# Patient Record
Sex: Female | Born: 2005 | Race: Black or African American | Hispanic: No | Marital: Single | Smoking: Never smoker
Health system: Southern US, Community
[De-identification: ages and names within clinical notes are randomized; demographics above are authoritative.]

## PROBLEM LIST (undated history)

## (undated) DIAGNOSIS — J40 Bronchitis, not specified as acute or chronic: Secondary | ICD-10-CM

## (undated) DIAGNOSIS — E669 Obesity, unspecified: Secondary | ICD-10-CM

## (undated) DIAGNOSIS — J45909 Unspecified asthma, uncomplicated: Secondary | ICD-10-CM

## (undated) DIAGNOSIS — F419 Anxiety disorder, unspecified: Secondary | ICD-10-CM

## (undated) HISTORY — DX: Anxiety disorder, unspecified: F41.9

## (undated) HISTORY — DX: Obesity, unspecified: E66.9

---
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
## 2023-01-06 ENCOUNTER — Ambulatory Visit
Admission: EM | Admit: 2023-01-06 | Discharge: 2023-01-06 | Disposition: A | Discharge status: Home / Self Care | Payer: BC Managed Care – PPO | Attending: Nurse Practitioner | Admitting: Nurse Practitioner

## 2023-01-06 DIAGNOSIS — Z1152 Encounter for screening for COVID-19: Secondary | ICD-10-CM | POA: Insufficient documentation

## 2023-01-06 DIAGNOSIS — Z20822 Contact with and (suspected) exposure to covid-19: Secondary | ICD-10-CM | POA: Insufficient documentation

## 2023-01-06 NOTE — ED Provider Notes (Signed)
[facility]    CSN: [id number] Arrival date & time: 01/06/23  1448      History   Chief Complaint No chief complaint on file.   HPI Katie Erickson is a 17 y.o. female.   The history is provided by the patient.   Patient presents for COVID testing.  Patient states she was exposed by her friend who recently tested positive.  Patient denies symptoms to include fever, chills, headache, nasal congestion, runny nose, cough, chest pain, nausea, vomiting, or diarrhea.  She states that she does have mild abdominal pain, but states this is normal around the time of her menstrual cycle.  Patient does have a history of asthma.  Past Medical History:  Diagnosis Date   Asthma    Bronchitis    Obesity     Patient Active Problem List   Diagnosis Date Noted   Encounter for initial prescription of contraceptive pills 12/09/2022   Pregnancy examination or test, negative result 12/09/2022   History of chest pain 12/09/2022   Family history of heart disease 12/09/2022   Family history of cardiac arrest 12/09/2022   Chest pain 08/12/2022   Encounter for surveillance of contraceptive pills 05/31/2022   Screening examination for STD (sexually transmitted disease) 05/31/2022   Encounter for menstrual regulation 09/02/2020   Dysmenorrhea 09/02/2020   Irregular periods 09/02/2020   BMI (body mass index), pediatric, greater than or equal to 95% for age 41/22/2021   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 No pertinent surgical history.  OB History     Gravida  0   Para  0   Term  0   Preterm  0   AB  0   Living  0      SAB  0   IAB  0   Ectopic  0   Multiple  0   Live Births  0            Home Medications    Prior to Admission medications   Medication Sig Start Date End Date Taking? Authorizing Provider  cetirizine (ZYRTEC) 10 MG tablet Take 1 tablet (10 mg total) by mouth daily. 02/23/22   Kellie Murrill-Warren,  Sadie Haber, NP  fluticasone (FLONASE) 50 MCG/ACT nasal spray Place 2 sprays into both nostrils daily. Patient taking differently: Place 2 sprays into both nostrils as needed. 02/23/22   Altin Sease-Warren, Sadie Haber, NP  ibuprofen (ADVIL) 800 MG tablet Take 1 tablet (800 mg total) by mouth every 6 (six) hours as needed for moderate pain. 10/07/22   Gilda Crease, MD  lidocaine (XYLOCAINE) 2 % solution Gargle and spit 5 mL every 6 hours as needed for throat pain or discomfort. 10/04/22   Bexton Haak-Warren, Sadie Haber, NP  loperamide (IMODIUM) 2 MG capsule Take 1 capsule (2 mg total) by mouth 4 (four) times daily as needed for diarrhea or loose stools. 06/04/22   Aarsh Fristoe-Warren, Sadie Haber, NP  loratadine (CLARITIN) 10 MG tablet TAKE 1 TABLET BY MOUTH ONCE DAILY. 08/08/20   Richrd Sox, MD  montelukast (SINGULAIR) 4 MG chewable tablet CHEW 1 TABLET BY MOUTH AT BEDTIME. 08/08/20   Richrd Sox, MD  nitroGLYCERIN (NITROSTAT) 0.4 MG SL tablet Place 1 tablet (0.4 mg total) under the tongue every 5 (five) minutes as needed for chest pain. 08/12/22   Del Nigel Berthold, FNP  norethindrone (MICRONOR) 0.35 MG tablet Take 1 tablet (0.35 mg total) by mouth daily. 12/09/22  Cyril Mourning A, NP  pantoprazole (PROTONIX) 40 MG tablet Take 1 tablet (40 mg total) by mouth daily. 10/07/22   Gilda Crease, MD  triamcinolone cream (KENALOG) 0.1 % SMARTSIG:1 Application Topical 2-3 Times Daily 07/03/20   [provider]    Family History Family History  Problem Relation Age of Onset   Hypertension Paternal Grandfather    Hypertension Paternal Grandmother    Heart disease Paternal Grandmother    Kidney disease Paternal Grandmother    Hypertension Maternal Grandmother    Diabetes Maternal Grandmother    Bronchitis Mother    Diabetes Mother    Asthma Sister    Other Sister        cardiac arrest at age 110   Cancer Other        on both sides of family   Heart disease Other 51       had MI and  died    Social History Social History   Tobacco Use   Smoking status: Never    Passive exposure: Yes   Smokeless tobacco: Never   Tobacco comments:    mom  Vaping Use   Vaping status: Never Used  Substance Use Topics   Alcohol use: Not Currently    Comment: once in a while   Drug use: Yes    Types: Marijuana    Comment: once in a while     Allergies   Cinnamon   Review of Systems Review of Systems Per HPI  Physical Exam Triage Vital Signs ED Triage Vitals  Encounter Vitals Group     BP 01/06/23 1454 119/75     Systolic BP Percentile --      Diastolic BP Percentile --      Pulse Rate 01/06/23 1454 91     Resp 01/06/23 1454 20     Temp 01/06/23 1454 98.8 F (37.1 C)     Temp Source 01/06/23 1454 Oral     SpO2 01/06/23 1454 97 %     Weight --      Height --      Head Circumference --      Peak Flow --      Pain Score 01/06/23 1455 0     Pain Loc --      Pain Education --      Exclude from Growth Chart --    No data found.  Updated Vital Signs BP 119/75 (BP Location: Right Arm)   Pulse 91   Temp 98.8 F (37.1 C) (Oral)   Resp 20   LMP 12/06/2022   SpO2 97%   Visual Acuity Right Eye Distance:   Left Eye Distance:   Bilateral Distance:    Right Eye Near:   Left Eye Near:    Bilateral Near:     Physical Exam Vitals and nursing note reviewed.  Constitutional:      General: She is not in acute distress.    Appearance: Normal appearance.  HENT:     Head: Normocephalic.     Right Ear: Tympanic membrane, ear canal and external ear normal.     Left Ear: Tympanic membrane, ear canal and external ear normal.     Nose: Nose normal.     Mouth/Throat:     Lips: Pink.     Mouth: Mucous membranes are moist.     Pharynx: Oropharynx is clear. Uvula midline. No pharyngeal swelling, posterior oropharyngeal erythema or postnasal drip.  Eyes:     Extraocular Movements: Extraocular  movements intact.     Conjunctiva/sclera: Conjunctivae normal.      Pupils: Pupils are equal, round, and reactive to light.  Cardiovascular:     Rate and Rhythm: Normal rate and regular rhythm.     Pulses: Normal pulses.     Heart sounds: Normal heart sounds.  Pulmonary:     Effort: Pulmonary effort is normal. No respiratory distress.     Breath sounds: Normal breath sounds. No stridor. No wheezing, rhonchi or rales.  Abdominal:     General: Bowel sounds are normal.     Palpations: Abdomen is soft.     Tenderness: There is no abdominal tenderness.  Musculoskeletal:     Cervical back: Normal range of motion.  Skin:    General: Skin is warm and dry.  Neurological:     General: No focal deficit present.     Mental Status: She is alert and oriented to person, place, and time.  Psychiatric:        Mood and Affect: Mood normal.        Behavior: Behavior normal.      UC Treatments / Results  Labs (all labs ordered are listed, but only abnormal results are displayed) Labs Reviewed  SARS CORONAVIRUS 2 (TAT 6-24 HRS)    EKG   Radiology No results found.  Procedures Procedures (including critical care time)  Medications Ordered in UC Medications - No data to display  Initial Impression / Assessment and Plan / UC Course  I have reviewed the triage vital signs and the nursing notes.  Pertinent labs & imaging results that were available during my care of the patient were reviewed by me and considered in my medical decision making (see chart for details).  Patient presents for COVID testing, she currently is asymptomatic.  Patient is able to receive Paxlovid if her COVID test is positive.  Supportive care recommendations were provided and discussed with the patient to include over-the-counter analgesics for pain or discomfort, and to wear a mask if she develops symptoms.  Patient was advised if she develops fever, she should remain home until she has been fever free for at least 24 hours.  Patient is in agreement with this plan of care and  verbalizes understanding.  All questions were answered.  Patient stable for discharge.   Final Clinical Impressions(s) / UC Diagnoses   Final diagnoses:  Close exposure to COVID-19 virus  Encounter for screening for COVID-19     Discharge Instructions      Your results will be available within the next 24 hours.  You can access your results via MyChart.  If your test is positive, you will be contacted. If you begin to develop symptoms, you should wear a mask until symptoms have resolved. If you develop fever, you should remain home until you have been fever free for at least 24 hours with no medication. Follow-up as needed.     ED Prescriptions   None    PDMP not reviewed this encounter.   Abran Cantor, NP 01/06/23 [phone]

## 2023-01-06 NOTE — Discharge Instructions (Signed)
Your results will be available within the next 24 hours.  You can access your results via MyChart.  If your test is positive, you will be contacted. If you begin to develop symptoms, you should wear a mask until symptoms have resolved. If you develop fever, you should remain home until you have been fever free for at least 24 hours with no medication. Follow-up as needed.

## 2023-01-06 NOTE — ED Triage Notes (Signed)
Pt reports she has been exposed to Covid and wants to be tested.

## 2023-01-07 LAB — SARS CORONAVIRUS 2 (TAT 6-24 HRS): SARS Coronavirus 2: NEGATIVE

## 2023-01-13 ENCOUNTER — Encounter: Payer: Self-pay | Admitting: *Deleted

## 2023-01-14 ENCOUNTER — Ambulatory Visit: Payer: BC Managed Care – PPO | Admitting: Internal Medicine

## 2023-01-19 ENCOUNTER — Ambulatory Visit: Payer: BC Managed Care – PPO | Admitting: Family Medicine

## 2023-02-02 ENCOUNTER — Telehealth: Payer: Self-pay | Admitting: Family Medicine

## 2023-02-02 ENCOUNTER — Encounter: Payer: Self-pay | Admitting: Family Medicine

## 2023-02-02 ENCOUNTER — Ambulatory Visit: Payer: BC Managed Care – PPO | Admitting: Family Medicine

## 2023-02-02 VITALS — BP 111/76 | HR 89 | Ht 65.0 in | Wt 234.0 lb

## 2023-02-02 DIAGNOSIS — L259 Unspecified contact dermatitis, unspecified cause: Secondary | ICD-10-CM

## 2023-02-02 MED ORDER — HYDROXYZINE HCL 10 MG PO TABS
10.0000 mg | ORAL_TABLET | Freq: Three times a day (TID) | ORAL | 0 refills | Status: DC | PRN
Start: 2023-02-02 — End: 2023-05-19

## 2023-02-02 MED ORDER — TRIAMCINOLONE ACETONIDE 0.1 % EX CREA
TOPICAL_CREAM | Freq: Two times a day (BID) | CUTANEOUS | 3 refills | Status: AC
Start: 2023-02-02 — End: ?

## 2023-02-02 NOTE — Telephone Encounter (Signed)
There is no message. Did you mean to send one?

## 2023-02-02 NOTE — Assessment & Plan Note (Signed)
(  KENALOG) 0.1 % 2 times daily Hydroxyzine PRN Moisturize regularly: Apply a fragrance-free, hypoallergenic moisturizer (e.g., Cetaphil, CeraVe, or Eucerin) to help restore the skin barrier and prevent dryness.  Discussed watch for signs of infection: If the rash becomes increasingly red, warm, swollen, or develops pus, you may have a skin infection- Follow up

## 2023-02-02 NOTE — Patient Instructions (Signed)
        Great to see you today.  I have refilled the medication(s) we provide.    - Please take medications as prescribed. - Follow up with your primary health provider if any health concerns arises. - If symptoms worsen please contact your primary care provider and/or visit the emergency department.  

## 2023-02-02 NOTE — Telephone Encounter (Deleted)
Tabitha called from [employer] asking seen patient for her oxygen last 6 months, need [id number] notes faxed to [phone] and any questions call back # [phone] ext. [phone]

## 2023-02-02 NOTE — Progress Notes (Signed)
Patient Office Visit   Subjective   Patient ID: Katie Erickson, female    DOB: 08/06/2005  Age: 17 y.o. MRN: [id number]  CC:  Chief Complaint  Patient presents with   Rash    Patient complains of bilateral rash on arms starting about a month ago.     HPI Katie Erickson 17 year old  presents to the clinical for bilateral rash on arms. She  has a past medical history of Asthma, Bronchitis, and Obesity.  Rash This is a new problem. The problem has been resolved since onset. The affected locations include the right arm and left arm. The rash is characterized by dryness and itchiness. It is unknown if there was an exposure to a precipitant. Pertinent negatives include no congestion, fatigue or shortness of breath. Past treatments include moisturizer and anti-itch cream. The treatment provided significant relief. Her past medical history is significant for eczema.      Outpatient Encounter Medications as of 02/02/2023  Medication Sig   cetirizine (ZYRTEC) 10 MG tablet Take 1 tablet (10 mg total) by mouth daily.   fluticasone (FLONASE) 50 MCG/ACT nasal spray Place 2 sprays into both nostrils daily. (Patient taking differently: Place 2 sprays into both nostrils as needed.)   hydrOXYzine (ATARAX) 10 MG tablet Take 1 tablet (10 mg total) by mouth 3 (three) times daily as needed.   ibuprofen (ADVIL) 800 MG tablet Take 1 tablet (800 mg total) by mouth every 6 (six) hours as needed for moderate pain.   lidocaine (XYLOCAINE) 2 % solution Gargle and spit 5 mL every 6 hours as needed for throat pain or discomfort.   loperamide (IMODIUM) 2 MG capsule Take 1 capsule (2 mg total) by mouth 4 (four) times daily as needed for diarrhea or loose stools.   loratadine (CLARITIN) 10 MG tablet TAKE 1 TABLET BY MOUTH ONCE DAILY.   montelukast (SINGULAIR) 4 MG chewable tablet CHEW 1 TABLET BY MOUTH AT BEDTIME.   nitroGLYCERIN (NITROSTAT) 0.4 MG SL tablet Place 1 tablet (0.4 mg total) under the tongue every 5  (five) minutes as needed for chest pain.   norethindrone (MICRONOR) 0.35 MG tablet Take 1 tablet (0.35 mg total) by mouth daily.   pantoprazole (PROTONIX) 40 MG tablet Take 1 tablet (40 mg total) by mouth daily.   [DISCONTINUED] triamcinolone cream (KENALOG) 0.1 % SMARTSIG:1 Application Topical 2-3 Times Daily   triamcinolone cream (KENALOG) 0.1 % Apply topically 2 (two) times daily.   No facility-administered encounter medications on file as of 02/02/2023.    No past surgical history on file.  Review of Systems  Constitutional:  Negative for fatigue.  HENT:  Negative for congestion.   Eyes:  Negative for blurred vision.  Respiratory:  Negative for shortness of breath.   Cardiovascular:  Negative for chest pain.  Skin:  Positive for itching and rash.  Neurological:  Negative for dizziness.      Objective    BP 111/76   Pulse 89   Ht 5\' 5"  (1.651 m)   Wt 234 lb (106.1 kg)   SpO2 97%   BMI 38.94 kg/m   Physical Exam Vitals reviewed.  Constitutional:      General: She is not in acute distress.    Appearance: Normal appearance. She is not ill-appearing, toxic-appearing or diaphoretic.  HENT:     Head: Normocephalic.  Eyes:     General:        Right eye: No discharge.  Left eye: No discharge.     Conjunctiva/sclera: Conjunctivae normal.  Cardiovascular:     Rate and Rhythm: Normal rate.     Pulses: Normal pulses.     Heart sounds: Normal heart sounds.  Pulmonary:     Effort: Pulmonary effort is normal. No respiratory distress.     Breath sounds: Normal breath sounds.  Musculoskeletal:        General: Normal range of motion.     Cervical back: Normal range of motion.  Skin:    General: Skin is warm and dry.     Capillary Refill: Capillary refill takes less than 2 seconds.     Findings: No bruising, erythema, lesion or rash.  Neurological:     Mental Status: She is alert.     Coordination: Coordination normal.     Gait: Gait normal.  Psychiatric:         Mood and Affect: Mood normal.       Assessment & Plan:  Contact dermatitis and eczema Assessment & Plan: (KENALOG) 0.1 % 2 times daily Hydroxyzine PRN Moisturize regularly: Apply a fragrance-free, hypoallergenic moisturizer (e.g., Cetaphil, CeraVe, or Eucerin) to help restore the skin barrier and prevent dryness.  Discussed watch for signs of infection: If the rash becomes increasingly red, warm, swollen, or develops pus, you may have a skin infection- Follow up  Orders: -     hydrOXYzine HCl; Take 1 tablet (10 mg total) by mouth 3 (three) times daily as needed.  Dispense: 21 tablet; Refill: 0 -     Triamcinolone Acetonide; Apply topically 2 (two) times daily.  Dispense: 80 g; Refill: 3    Return if symptoms worsen or fail to improve.   Cruzita Lederer Newman Nip, FNP

## 2023-02-04 NOTE — Telephone Encounter (Signed)
No sorry.

## 2023-02-11 ENCOUNTER — Ambulatory Visit
Admission: EM | Admit: 2023-02-11 | Discharge: 2023-02-11 | Disposition: A | Discharge status: Home / Self Care | Payer: BC Managed Care – PPO | Attending: Nurse Practitioner | Admitting: Nurse Practitioner

## 2023-02-11 DIAGNOSIS — J069 Acute upper respiratory infection, unspecified: Secondary | ICD-10-CM | POA: Insufficient documentation

## 2023-02-11 DIAGNOSIS — J029 Acute pharyngitis, unspecified: Secondary | ICD-10-CM | POA: Diagnosis not present

## 2023-02-11 DIAGNOSIS — Z8709 Personal history of other diseases of the respiratory system: Secondary | ICD-10-CM | POA: Diagnosis not present

## 2023-02-11 LAB — POCT RAPID STREP A (OFFICE): Rapid Strep A Screen: NEGATIVE

## 2023-02-11 MED ORDER — PSEUDOEPH-BROMPHEN-DM 30-2-10 MG/5ML PO SYRP: 5.0000 mL | ORAL_SOLUTION | Freq: Four times a day (QID) | ORAL | 0 refills | Status: DC | PRN

## 2023-02-11 MED ORDER — FLUTICASONE PROPIONATE 50 MCG/ACT NA SUSP: 2.0000 | Freq: Every day | NASAL | 0 refills | Status: AC

## 2023-02-11 MED ORDER — PREDNISONE 20 MG PO TABS: 40.0000 mg | ORAL_TABLET | Freq: Every day | ORAL | 0 refills | Status: AC

## 2023-02-11 NOTE — Discharge Instructions (Signed)
The rapid strep test was negative.  A throat culture is pending.  You will be contacted if the pending test result is abnormal. Take medication as prescribed. Increase fluids and allow for plenty of rest. May take over-the-counter Tylenol or ibuprofen as needed for pain, fever, general discomfort. Warm salt water gargles 3-4 times daily as needed for throat pain or discomfort. Recommend a soft diet to include soup, broth, yogurt, pudding, or Jell-O while symptoms persist. Recommend throat lozenges or Chloraseptic throat spray for throat pain or discomfort. Recommend use of a humidifier in your bedroom at nighttime during sleep and sleeping elevated on pillows while cough symptoms persist. If you experience wheezing, difficulty breathing, shortness of breath, or become unable to speak in a complete sentence, please go to the emergency department immediately. If symptoms do not improve over the next 7 to 10 days, or worsen over that time, you may follow-up in this clinic for further evaluation. Follow-up as needed.

## 2023-02-11 NOTE — ED Provider Notes (Signed)
[facility]    CSN: [id number] Arrival date & time: 02/11/23  1155      History   Chief Complaint Chief Complaint  Patient presents with   Sore Throat    HPI Katie Erickson is a 17 y.o. female.   The history is provided by the patient.   Patient presents for complaints of nasal congestion, runny nose, sore throat, and cough.  Symptoms have been present for the past 2 days.  Patient denies fever, chills, headache, wheezing, difficulty breathing, chest pain, abdominal pain, nausea, vomiting, diarrhea, or rash.  Patient reports she has been taking an old prescription of amoxicillin, allergy medication and ibuprofen with minimal relief.  Patient denies any obvious known sick contacts.  Patient declines COVID testing today.  Past Medical History:  Diagnosis Date   Asthma    Bronchitis    Obesity     Patient Active Problem List   Diagnosis Date Noted   Contact dermatitis and eczema 02/02/2023   Encounter for initial prescription of contraceptive pills 12/09/2022   Pregnancy examination or test, negative result 12/09/2022   History of chest pain 12/09/2022   Family history of heart disease 12/09/2022   Family history of cardiac arrest 12/09/2022   Chest pain 08/12/2022   Encounter for surveillance of contraceptive pills 05/31/2022   Screening examination for STD (sexually transmitted disease) 05/31/2022   Encounter for menstrual regulation 09/02/2020   Dysmenorrhea 09/02/2020   Irregular periods 09/02/2020   BMI (body mass index), pediatric, greater than or equal to 95% for age 12/23/2019   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 No pertinent surgical history.  OB History     Gravida  0   Para  0   Term  0   Preterm  0   AB  0   Living  0      SAB  0   IAB  0   Ectopic  0   Multiple  0   Live Births  0            Home Medications    Prior to Admission medications   Medication Sig  Start Date End Date Taking? Authorizing Provider  brompheniramine-pseudoephedrine-DM 30-2-10 MG/5ML syrup Take 5 mLs by mouth 4 (four) times daily as needed. 02/11/23  Yes Shawnika Pepin-Warren, Sadie Haber, NP  fluticasone (FLONASE) 50 MCG/ACT nasal spray Place 2 sprays into both nostrils daily. 02/11/23  Yes Faithe Ariola-Warren, Sadie Haber, NP  predniSONE (DELTASONE) 20 MG tablet Take 2 tablets (40 mg total) by mouth daily with breakfast for 5 days. 02/11/23 02/16/23 Yes Caasi Giglia-Warren, Sadie Haber, NP  cetirizine (ZYRTEC) 10 MG tablet Take 1 tablet (10 mg total) by mouth daily. 02/23/22   Ithzel Fedorchak-Warren, Sadie Haber, NP  hydrOXYzine (ATARAX) 10 MG tablet Take 1 tablet (10 mg total) by mouth 3 (three) times daily as needed. 02/02/23   Del Nigel Berthold, FNP  ibuprofen (ADVIL) 800 MG tablet Take 1 tablet (800 mg total) by mouth every 6 (six) hours as needed for moderate pain. 10/07/22   Gilda Crease, MD  lidocaine (XYLOCAINE) 2 % solution Gargle and spit 5 mL every 6 hours as needed for throat pain or discomfort. 10/04/22   Lerin Jech-Warren, Sadie Haber, NP  loperamide (IMODIUM) 2 MG capsule Take 1 capsule (2 mg total) by mouth 4 (four) times daily as needed for diarrhea or loose stools. 06/04/22   Kade Demicco-Warren, Sadie Haber, NP  loratadine (CLARITIN) 10 MG tablet  TAKE 1 TABLET BY MOUTH ONCE DAILY. 08/08/20   Richrd Sox, MD  montelukast (SINGULAIR) 4 MG chewable tablet CHEW 1 TABLET BY MOUTH AT BEDTIME. 08/08/20   Richrd Sox, MD  nitroGLYCERIN (NITROSTAT) 0.4 MG SL tablet Place 1 tablet (0.4 mg total) under the tongue every 5 (five) minutes as needed for chest pain. 08/12/22   Del Nigel Berthold, FNP  norethindrone (MICRONOR) 0.35 MG tablet Take 1 tablet (0.35 mg total) by mouth daily. 12/09/22   Adline Potter, NP  pantoprazole (PROTONIX) 40 MG tablet Take 1 tablet (40 mg total) by mouth daily. 10/07/22   Gilda Crease, MD  triamcinolone cream (KENALOG) 0.1 % Apply topically 2 (two) times daily.  02/02/23   Del Nigel Berthold, FNP    Family History Family History  Problem Relation Age of Onset   Hypertension Paternal Grandfather    Hypertension Paternal Grandmother    Heart disease Paternal Grandmother    Kidney disease Paternal Grandmother    Hypertension Maternal Grandmother    Diabetes Maternal Grandmother    Bronchitis Mother    Diabetes Mother    Asthma Sister    Other Sister        cardiac arrest at age 71   Cancer Other        on both sides of family   Heart disease Other 81       had MI and died    Social History Social History   Tobacco Use   Smoking status: Never    Passive exposure: Yes   Smokeless tobacco: Never   Tobacco comments:    mom  Vaping Use   Vaping status: Never Used  Substance Use Topics   Alcohol use: Not Currently    Comment: once in a while   Drug use: Yes    Types: Marijuana    Comment: once in a while     Allergies   Cinnamon   Review of Systems Review of Systems Per HPI  Physical Exam Triage Vital Signs ED Triage Vitals  Encounter Vitals Group     BP 02/11/23 1325 126/73     Systolic BP Percentile --      Diastolic BP Percentile --      Pulse Rate 02/11/23 1325 99     Resp 02/11/23 1325 20     Temp 02/11/23 1325 99.5 F (37.5 C)     Temp Source 02/11/23 1325 Oral     SpO2 02/11/23 1325 97 %     Weight --      Height --      Head Circumference --      Peak Flow --      Pain Score 02/11/23 1327 3     Pain Loc --      Pain Education --      Exclude from Growth Chart --    No data found.  Updated Vital Signs BP 126/73 (BP Location: Right Arm)   Pulse 99   Temp 99.5 F (37.5 C) (Oral)   Resp 20   LMP 02/06/2023   SpO2 97%   Visual Acuity Right Eye Distance:   Left Eye Distance:   Bilateral Distance:    Right Eye Near:   Left Eye Near:    Bilateral Near:     Physical Exam Vitals and nursing note reviewed.  Constitutional:      General: She is not in acute distress.    Appearance: She  is well-developed.  HENT:     Head: Normocephalic.     Right Ear: Tympanic membrane and ear canal normal.     Left Ear: Tympanic membrane and ear canal normal.     Nose: Congestion present.     Right Turbinates: Enlarged and swollen.     Left Turbinates: Enlarged and swollen.     Right Sinus: No maxillary sinus tenderness or frontal sinus tenderness.     Left Sinus: No maxillary sinus tenderness or frontal sinus tenderness.     Mouth/Throat:     Mouth: Mucous membranes are moist.     Pharynx: Posterior oropharyngeal erythema present. No pharyngeal swelling, oropharyngeal exudate or uvula swelling.     Tonsils: No tonsillar exudate. 1+ on the right. 1+ on the left.     Comments: Cobblestoning present to posterior oropharynx  Eyes:     Conjunctiva/sclera: Conjunctivae normal.     Pupils: Pupils are equal, round, and reactive to light.  Cardiovascular:     Rate and Rhythm: Normal rate and regular rhythm.     Heart sounds: Normal heart sounds.  Pulmonary:     Effort: Pulmonary effort is normal. No respiratory distress.     Breath sounds: Normal breath sounds. No stridor. No wheezing, rhonchi or rales.  Abdominal:     General: Bowel sounds are normal.     Palpations: Abdomen is soft.     Tenderness: There is no abdominal tenderness.  Musculoskeletal:     Cervical back: Normal range of motion.  Lymphadenopathy:     Cervical: No cervical adenopathy.  Skin:    General: Skin is warm and dry.  Neurological:     General: No focal deficit present.     Mental Status: She is alert and oriented to person, place, and time.  Psychiatric:        Mood and Affect: Mood normal.        Behavior: Behavior normal.      UC Treatments / Results  Labs (all labs ordered are listed, but only abnormal results are displayed) Labs Reviewed  CULTURE, GROUP A STREP [facility])  POCT RAPID STREP A (OFFICE)    EKG   Radiology No results found.  Procedures Procedures (including critical care  time)  Medications Ordered in UC Medications - No data to display  Initial Impression / Assessment and Plan / UC Course  I have reviewed the triage vital signs and the nursing notes.  Pertinent labs & imaging results that were available during my care of the patient were reviewed by me and considered in my medical decision making (see chart for details).  The patient is well-appearing, she is in no acute distress, vital signs are stable.  Patient declines COVID testing.  Do suspect a viral upper respiratory infection, with exacerbation of her underlying seasonal allergies and mild exacerbation of her asthma.  Will provide symptomatic treatment with Bromfed-DM for her cough, fluticasone 50 mcg nasal spray for nasal congestion and runny nose, and prednisone to treat underlying asthma and cough.  Supportive care recommendations were provided and discussed with the patient to include increasing fluids, allowing for plenty of rest, warm salt water gargles, and use of throat spray or cough drops for throat pain or discomfort.  Patient was given strict ER follow-up precautions along with indications of when to follow-up in this clinic.  Patient is in agreement with this plan of care and verbalizes understanding.  All questions were answered.  Patient stable for discharge.  Final Clinical Impressions(s) / UC  Diagnoses   Final diagnoses:  Viral upper respiratory tract infection with cough  Sore throat  History of asthma  History of allergic rhinitis     Discharge Instructions      The rapid strep test was negative.  A throat culture is pending.  You will be contacted if the pending test result is abnormal. Take medication as prescribed. Increase fluids and allow for plenty of rest. May take over-the-counter Tylenol or ibuprofen as needed for pain, fever, general discomfort. Warm salt water gargles 3-4 times daily as needed for throat pain or discomfort. Recommend a soft diet to include soup,  broth, yogurt, pudding, or Jell-O while symptoms persist. Recommend throat lozenges or Chloraseptic throat spray for throat pain or discomfort. Recommend use of a humidifier in your bedroom at nighttime during sleep and sleeping elevated on pillows while cough symptoms persist. If you experience wheezing, difficulty breathing, shortness of breath, or become unable to speak in a complete sentence, please go to the emergency department immediately. If symptoms do not improve over the next 7 to 10 days, or worsen over that time, you may follow-up in this clinic for further evaluation. Follow-up as needed.     ED Prescriptions     Medication Sig Dispense Auth. Provider   brompheniramine-pseudoephedrine-DM 30-2-10 MG/5ML syrup Take 5 mLs by mouth 4 (four) times daily as needed. 140 mL Laurel Smeltz-Warren, Sadie Haber, NP   fluticasone (FLONASE) 50 MCG/ACT nasal spray Place 2 sprays into both nostrils daily. 16 g Nickie Warwick-Warren, Sadie Haber, NP   predniSONE (DELTASONE) 20 MG tablet Take 2 tablets (40 mg total) by mouth daily with breakfast for 5 days. 10 tablet Ivory Bail-Warren, Sadie Haber, NP      PDMP not reviewed this encounter.   Abran Cantor, NP 02/11/23 [phone]

## 2023-02-11 NOTE — ED Triage Notes (Signed)
Pt reports head, stomach, nasal congestion and throat pain x 2 days.    Took allergy meds and ibuprofen but no relief.

## 2023-02-14 LAB — CULTURE, GROUP A STREP (THRC)

## 2023-02-22 ENCOUNTER — Encounter: Payer: Self-pay | Admitting: Internal Medicine

## 2023-02-22 ENCOUNTER — Encounter: Payer: Self-pay | Admitting: *Deleted

## 2023-02-22 ENCOUNTER — Telehealth: Payer: Self-pay | Admitting: Internal Medicine

## 2023-02-22 ENCOUNTER — Ambulatory Visit: Payer: BC Managed Care – PPO | Attending: Internal Medicine | Admitting: Internal Medicine

## 2023-02-22 VITALS — BP 122/80 | HR 90 | Ht 65.0 in | Wt 230.2 lb

## 2023-02-22 DIAGNOSIS — Z8241 Family history of sudden cardiac death: Secondary | ICD-10-CM | POA: Diagnosis not present

## 2023-02-22 DIAGNOSIS — Z136 Encounter for screening for cardiovascular disorders: Secondary | ICD-10-CM | POA: Diagnosis not present

## 2023-02-22 NOTE — Patient Instructions (Addendum)
Medication Instructions:  Your physician recommends that you continue on your current medications as directed. Please refer to the Current Medication list given to you today.  Labwork: none  Testing/Procedures: Your physician has requested that you have an echocardiogram. Echocardiography is a painless test that uses sound waves to create images of your heart. It provides your doctor with information about the size and shape of your heart and how well your heart's chambers and valves are working. This procedure takes approximately one hour. There are no restrictions for this procedure. Please do NOT wear cologne, perfume, aftershave, or lotions (deodorant is allowed). Please arrive 15 minutes prior to your appointment time. Your physician has requested that you have an exercise tolerance test. For further information please visit [email or web address]. Please also follow instruction sheet, as given.  Follow-Up: Your physician recommends that you schedule a follow-up appointment in: pending  Any Other Special Instructions Will Be Listed Below (If Applicable).  If you need a refill on your cardiac medications before your next appointment, please call your pharmacy.

## 2023-02-22 NOTE — Progress Notes (Signed)
Cardiology Office Note  Date: 02/22/2023   ID: Hollis, Burruel 11/27/2005, MRN [id number]  PCP:  Rica Records, FNP  Cardiologist:  Marjo Bicker, MD Electrophysiologist:  None   History of Present Illness: Katie Erickson is a 17 y.o. female known to have asthma/bronchitis, morbid obesity was referred to cardiology clinic for evaluation of chest pains and family history of cardiac death.  Patient had chest pain starting in the middle school when she was in stress and started to take hydroxyzine after which her chest pain is completely resolved.  Likely secondary to anxiety.  She denies having any chest pains with exertion ever.  She also her family history of sudden death, her sister passed away recently, autopsy revealed blood clot in the heart vessel and also patient's cousin passed away in his/her 34s with a heart attack.  No symptoms overall, no DOE, orthopnea, PND, dizziness, palpitations or syncope.  Past Medical History:  Diagnosis Date   Asthma    Bronchitis    Obesity     No past surgical history on file.  Current Outpatient Medications  Medication Sig Dispense Refill   brompheniramine-pseudoephedrine-DM 30-2-10 MG/5ML syrup Take 5 mLs by mouth 4 (four) times daily as needed. 140 mL 0   cetirizine (ZYRTEC) 10 MG tablet Take 1 tablet (10 mg total) by mouth daily. 30 tablet 0   fluticasone (FLONASE) 50 MCG/ACT nasal spray Place 2 sprays into both nostrils daily. 16 g 0   hydrOXYzine (ATARAX) 10 MG tablet Take 1 tablet (10 mg total) by mouth 3 (three) times daily as needed. 21 tablet 0   ibuprofen (ADVIL) 800 MG tablet Take 1 tablet (800 mg total) by mouth every 6 (six) hours as needed for moderate pain. 20 tablet 0   lidocaine (XYLOCAINE) 2 % solution Gargle and spit 5 mL every 6 hours as needed for throat pain or discomfort. 100 mL 0   loperamide (IMODIUM) 2 MG capsule Take 1 capsule (2 mg total) by mouth 4 (four) times daily as needed for  diarrhea or loose stools. 12 capsule 0   loratadine (CLARITIN) 10 MG tablet TAKE 1 TABLET BY MOUTH ONCE DAILY. 30 tablet 0   montelukast (SINGULAIR) 4 MG chewable tablet CHEW 1 TABLET BY MOUTH AT BEDTIME. 30 tablet 0   nitroGLYCERIN (NITROSTAT) 0.4 MG SL tablet Place 1 tablet (0.4 mg total) under the tongue every 5 (five) minutes as needed for chest pain. 50 tablet 3   norethindrone (MICRONOR) 0.35 MG tablet Take 1 tablet (0.35 mg total) by mouth daily. 28 tablet 11   pantoprazole (PROTONIX) 40 MG tablet Take 1 tablet (40 mg total) by mouth daily. 30 tablet 3   triamcinolone cream (KENALOG) 0.1 % Apply topically 2 (two) times daily. 80 g 3   No current facility-administered medications for this visit.   Allergies:  Cinnamon   Social History: The patient  reports that she has never smoked. She has been exposed to tobacco smoke. She has never used smokeless tobacco. She reports that she does not currently use alcohol. She reports current drug use. Drug: Marijuana.   Family History: The patient's family history includes Asthma in her sister; Bronchitis in her mother; Cancer in an other family member; Diabetes in her maternal grandmother and mother; Heart disease in her paternal grandmother; Heart disease (age of onset: 50) in an other family member; Hypertension in her maternal grandmother, paternal grandfather, and paternal grandmother; Kidney disease in her paternal grandmother; Other  in her sister.   ROS:  Please see the history of present illness. Otherwise, complete review of systems is positive for none  All other systems are reviewed and negative.   Physical Exam: VS:  BP 122/80   Pulse 90   Ht 5\' 5"  (1.651 m)   Wt 230 lb 3.2 oz (104.4 kg)   LMP 02/06/2023   SpO2 98%   BMI 38.31 kg/m , BMI Body mass index is 38.31 kg/m.  Wt Readings from Last 3 Encounters:  02/22/23 230 lb 3.2 oz (104.4 kg) (99%, Z= 2.31)*  02/02/23 234 lb (106.1 kg) (>99%, Z= 2.34)*  12/09/22 232 lb (105.2 kg)  (99%, Z= 2.32)*   * Growth percentiles are based on CDC (Girls, 2-20 Years) data.    General: Patient appears comfortable at rest. HEENT: Conjunctiva and lids normal, oropharynx clear with moist mucosa. Neck: Supple, no elevated JVP or carotid bruits, no thyromegaly. Lungs: Clear to auscultation, nonlabored breathing at rest. Cardiac: Regular rate and rhythm, no S3 or significant systolic murmur, no pericardial rub. Abdomen: Soft, nontender, no hepatomegaly, bowel sounds present, no guarding or rebound. Extremities: No pitting edema, distal pulses 2+. Skin: Warm and dry. Musculoskeletal: No kyphosis. Neuropsychiatric: Alert and oriented x3, affect grossly appropriate.  Recent Labwork: 08/12/2022: ALT 8; AST 11; TSH 1.890 10/06/2022: BUN 14; Creatinine, Ser 0.83; Hemoglobin 11.9; Platelets 348; Potassium 3.8; Sodium 136     Component Value Date/Time   CHOL 177 (H) 08/12/2022 1308   TRIG 62 08/12/2022 1308   HDL 65 08/12/2022 1308   CHOLHDL 2.7 08/12/2022 1308   LDLCALC 100 08/12/2022 1308     Assessment and Plan:  Family history of sudden cardiac death Chest pain likely secondary to anxiety   -Patient's sister suddenly died during a physical therapy session and patient's cousin also passed away in his/her 53s from heart attack.  Autopsy of her sister revealed blood clot in the heart vessel.  EKG today showed normal sinus rhythm, no evidence of preexcitation and normal QTc interval.  Will obtain 2D echocardiogram to rule out any structural heart disease and exercise tolerance test to rule out any exertional arrhythmias. -Chest pain usually resolves after taking hydroxyzine, likely secondary to anxiety.  No chest pain with exertion.  No further cardiac workup is indicated for this.   I have spent a total duration of 30 minutes reviewing the chart, face-to-face discussion/counseling of her medical condition, pathophysiology, evaluation, management and answering all her questions along  with documenting findings in the note.   Medication Adjustments/Labs and Tests Ordered: Current medicines are reviewed at length with the patient today.  Concerns regarding medicines are outlined above.    Disposition:  Follow up  pending results  Signed An Schnabel Verne Spurr, MD, 02/22/2023 4:18 PM    [facility] at [facility] [address] [place], [place], [place] [address]

## 2023-02-22 NOTE — Telephone Encounter (Signed)
Checking percert on the following patient for testing scheduled at [facility].    GXT   03/01/2023

## 2023-03-01 ENCOUNTER — Ambulatory Visit (HOSPITAL_COMMUNITY)
Admission: RE | Admit: 2023-03-01 | Discharge: 2023-03-01 | Disposition: A | Discharge status: Home / Self Care | Payer: BC Managed Care – PPO | Source: Ambulatory Visit | Attending: Internal Medicine | Admitting: Internal Medicine

## 2023-03-01 DIAGNOSIS — Z136 Encounter for screening for cardiovascular disorders: Secondary | ICD-10-CM | POA: Diagnosis not present

## 2023-03-01 DIAGNOSIS — Z8241 Family history of sudden cardiac death: Secondary | ICD-10-CM | POA: Insufficient documentation

## 2023-03-01 LAB — EXERCISE TOLERANCE TEST
Angina Index: 0
Base ST Depression (mm): 0 mm
Duke Treadmill Score: 5
Estimated workload: 7
Exercise duration (min): 5 min
Exercise duration (sec): 1 s
MPHR: 201 {beats}/min
Peak HR: 176 {beats}/min
Percent HR: 87 %
RPE: 15
Rest HR: 108 {beats}/min
ST Depression (mm): 0 mm

## 2023-03-03 ENCOUNTER — Ambulatory Visit: Payer: BC Managed Care – PPO | Attending: Internal Medicine

## 2023-03-03 ENCOUNTER — Encounter: Payer: Self-pay | Admitting: Cardiology

## 2023-03-11 ENCOUNTER — Ambulatory Visit (INDEPENDENT_AMBULATORY_CARE_PROVIDER_SITE_OTHER): Payer: BC Managed Care – PPO | Admitting: Adult Health

## 2023-03-11 ENCOUNTER — Encounter: Payer: Self-pay | Admitting: Adult Health

## 2023-03-11 VITALS — BP 132/77 | HR 90 | Ht 65.0 in | Wt 227.5 lb

## 2023-03-11 DIAGNOSIS — Z7689 Persons encountering health services in other specified circumstances: Secondary | ICD-10-CM | POA: Diagnosis not present

## 2023-03-11 DIAGNOSIS — Z3041 Encounter for surveillance of contraceptive pills: Secondary | ICD-10-CM | POA: Diagnosis not present

## 2023-03-11 NOTE — Progress Notes (Signed)
  Subjective:     Patient ID: Katie Erickson, female   DOB: 2005/09/30, 17 y.o.   MRN: [id number]  HPI Darin is a 17 year old black female,single, G0P0, back in follow up on starting micronor and doing well but having irregular periods, may skip one. Has seen cardiologist and had negative stress test.  PCP is I Polanco NP.  Review of Systems Period irregular on Micronor, may skip one Reviewed past medical,surgical, social and family history. Reviewed medications and allergies.     Objective:   Physical Exam BP 132/77 (BP Location: Left Arm, Patient Position: Sitting, Cuff Size: Normal)   Pulse 90   Ht 5\' 5"  (1.651 m)   Wt 227 lb 8 oz (103.2 kg)   LMP 03/09/2023   BMI 37.86 kg/m     Skin warm and dry.  Lungs: clear to ausculation bilaterally. Cardiovascular: regular rate and rhythm.   Upstream - 03/11/23 1011       Pregnancy Intention Screening   Does the patient want to become pregnant in the next year? No    Does the patient's partner want to become pregnant in the next year? No    Would the patient like to discuss contraceptive options today? No      Contraception Wrap Up   Current Method Oral Contraceptive    End Method Oral Contraceptive    Contraception Counseling Provided Yes             Assessment:     1. Encounter for menstrual regulation Periods not regular may skip one  2. Encounter for surveillance of contraceptive pills Will continue micronor, has refills      Plan:     Follow up in 8 months or sooner if needed

## 2023-03-29 ENCOUNTER — Other Ambulatory Visit (INDEPENDENT_AMBULATORY_CARE_PROVIDER_SITE_OTHER): Payer: BC Managed Care – PPO

## 2023-03-29 ENCOUNTER — Other Ambulatory Visit (HOSPITAL_COMMUNITY)
Admission: RE | Admit: 2023-03-29 | Discharge: 2023-03-29 | Disposition: A | Discharge status: Home / Self Care | Payer: BC Managed Care – PPO | Source: Ambulatory Visit | Attending: Obstetrics & Gynecology | Admitting: Obstetrics & Gynecology

## 2023-03-29 DIAGNOSIS — B3731 Acute candidiasis of vulva and vagina: Secondary | ICD-10-CM | POA: Insufficient documentation

## 2023-03-29 DIAGNOSIS — Z113 Encounter for screening for infections with a predominantly sexual mode of transmission: Secondary | ICD-10-CM | POA: Diagnosis not present

## 2023-03-29 DIAGNOSIS — N930 Postcoital and contact bleeding: Secondary | ICD-10-CM | POA: Insufficient documentation

## 2023-03-29 NOTE — Progress Notes (Signed)
   NURSE VISIT- VAGINITIS/STD  SUBJECTIVE:  Katie Erickson is a 17 y.o. G0P0000 GYN patientfemale here for a vaginal swab for vaginitis screening.  She reports the following symptoms: post coital bleeding for 3 days. Denies abnormal vaginal bleeding, significant pelvic pain, fever, or UTI symptoms.  OBJECTIVE:  LMP 03/09/2023   Appears well, in no apparent distress  ASSESSMENT: Vaginal swab for vaginitis screening/std screen  PLAN: Self-collected vaginal probe for Gonorrhea, Chlamydia, Trichomonas, Bacterial Vaginosis, Yeast sent to lab Treatment: to be determined once results are received Follow-up as needed if symptoms persist/worsen, or new symptoms develop  Jobe Marker  03/29/2023 9:36 AM

## 2023-03-30 LAB — CERVICOVAGINAL ANCILLARY ONLY
Bacterial Vaginitis (gardnerella): NEGATIVE
Candida Glabrata: NEGATIVE
Candida Vaginitis: POSITIVE — AB
Chlamydia: NEGATIVE
Comment: NEGATIVE
Comment: NEGATIVE
Comment: NEGATIVE
Comment: NEGATIVE
Comment: NEGATIVE
Comment: NORMAL
Neisseria Gonorrhea: NEGATIVE
Trichomonas: NEGATIVE

## 2023-04-04 ENCOUNTER — Other Ambulatory Visit: Payer: Self-pay | Admitting: Adult Health

## 2023-04-04 MED ORDER — FLUCONAZOLE 150 MG PO TABS: ORAL_TABLET | ORAL | 1 refills | Status: DC

## 2023-04-07 NOTE — Progress Notes (Signed)
Established Patient Office Visit   Subjective  Patient ID: Katie Erickson, female    DOB: 01-Dec-2005  Age: 17 y.o. MRN: [id number]  Chief Complaint  Patient presents with   Follow-up    Discuss B-12 injection  Concerns on how B-12 injection along w/ birthcontrol will affect body and weight over time.  Healthy Diet recommendations.     She  has a past medical history of Anxiety, Asthma, Bronchitis, and Obesity.  HPI Patient presents to the clinic for follow up. For the details of today's visit, please refer to assessment and plan.   Review of Systems  Constitutional:  Negative for chills and fever.  Respiratory:  Negative for shortness of breath.   Cardiovascular:  Negative for chest pain.  Neurological:  Negative for dizziness and headaches.      Objective:     BP 124/87   Pulse 92   Ht 5\' 5"  (1.651 m)   Wt 231 lb 1.3 oz (104.8 kg)   LMP 03/09/2023   SpO2 97%   BMI 38.45 kg/m  BP Readings from Last 3 Encounters:  04/08/23 124/87  03/11/23 132/77  02/22/23 122/80      Physical Exam Vitals reviewed.  Constitutional:      General: She is not in acute distress.    Appearance: Normal appearance. She is not ill-appearing, toxic-appearing or diaphoretic.  HENT:     Head: Normocephalic.  Eyes:     General:        Right eye: No discharge.        Left eye: No discharge.     Conjunctiva/sclera: Conjunctivae normal.  Cardiovascular:     Rate and Rhythm: Normal rate.     Pulses: Normal pulses.     Heart sounds: Normal heart sounds.  Pulmonary:     Effort: Pulmonary effort is normal. No respiratory distress.     Breath sounds: Normal breath sounds.  Musculoskeletal:        General: Normal range of motion.     Cervical back: Normal range of motion.  Skin:    General: Skin is warm and dry.     Capillary Refill: Capillary refill takes less than 2 seconds.  Neurological:     Mental Status: She is alert.     Coordination: Coordination normal.     Gait: Gait  normal.  Psychiatric:        Mood and Affect: Mood normal.        Behavior: Behavior normal.      No results found for any visits on 04/08/23.  The ASCVD Risk score (Arnett DK, et al., 2019) failed to calculate for the following reasons:   The 2019 ASCVD risk score is only valid for ages 37 to 51    Assessment & Plan:  Vitamin B12 deficiency Assessment & Plan: Vit B12 injection given today I advise to consume protein rich foods such as lean meats; poultry; eggs; seafood; beans, peas, and lentils; nuts and seeds; and soy products. Fish and red meat are excellent sources of vitamin B12. I encourage Vitamin B12 [phone] mcg supplements over the counter tablets once daily.   Orders: -     Vitamin B12 -     Cyanocobalamin  Vitamin D deficiency Assessment & Plan: Labs ordered,  I advise to taking  over the counter supplements of vitamin D 1000 IU/day to prevent low vitamin D levels.  Consume Vitamin D-Rich Foods: Fatty Fish: Salmon, mackerel, tuna, and sardines. Egg Yolks: A good source  if eaten whole. Fortified Foods: Milk, orange juice, cereals, and plant-based milks (like almond or soy milk). Mushrooms: Especially those exposed to sunlight or UV light. Cod Liver Oil: A concentrated source of vitamin D. Including these foods in your diet can help boost vitamin D levels   Discussed symptoms of Low Vitamin D:  Fatigue and low energy. Bone pain and muscle weakness. Increased risk of fractures or weak bones. Frequent illness or infections. Mood changes, like depression or anxiety. Hair loss or thinning.   Orders: -     VITAMIN D 25 Hydroxy (Vit-D Deficiency, Fractures)  Iron deficiency anemia, unspecified iron deficiency anemia type -     Iron, TIBC and Ferritin Panel    No follow-ups on file.   Cruzita Lederer Newman Nip, FNP

## 2023-04-07 NOTE — Patient Instructions (Signed)

## 2023-04-08 ENCOUNTER — Ambulatory Visit: Payer: BC Managed Care – PPO | Admitting: Family Medicine

## 2023-04-08 VITALS — BP 124/87 | HR 92 | Ht 65.0 in | Wt 231.1 lb

## 2023-04-08 DIAGNOSIS — E559 Vitamin D deficiency, unspecified: Secondary | ICD-10-CM | POA: Diagnosis not present

## 2023-04-08 DIAGNOSIS — E538 Deficiency of other specified B group vitamins: Secondary | ICD-10-CM | POA: Diagnosis not present

## 2023-04-08 DIAGNOSIS — D509 Iron deficiency anemia, unspecified: Secondary | ICD-10-CM | POA: Diagnosis not present

## 2023-04-08 MED ORDER — CYANOCOBALAMIN 1000 MCG/ML IJ SOLN
1000.0000 ug | Freq: Once | INTRAMUSCULAR | Status: AC
Start: 2023-04-08 — End: 2023-04-08
  Administered 2023-04-08: 1000 ug via INTRAMUSCULAR

## 2023-04-08 NOTE — Progress Notes (Signed)
Pt. Received injection w/o complications.

## 2023-04-08 NOTE — Assessment & Plan Note (Signed)
Labs ordered,  I advise to taking  over the counter supplements of vitamin D 1000 IU/day to prevent low vitamin D levels.  Consume Vitamin D-Rich Foods: Fatty Fish: Salmon, mackerel, tuna, and sardines. Egg Yolks: A good source if eaten whole. Fortified Foods: Milk, orange juice, cereals, and plant-based milks (like almond or soy milk). Mushrooms: Especially those exposed to sunlight or UV light. Cod Liver Oil: A concentrated source of vitamin D. Including these foods in your diet can help boost vitamin D levels   Discussed symptoms of Low Vitamin D:  Fatigue and low energy. Bone pain and muscle weakness. Increased risk of fractures or weak bones. Frequent illness or infections. Mood changes, like depression or anxiety. Hair loss or thinning.

## 2023-04-08 NOTE — Assessment & Plan Note (Signed)
Vit B12 injection given today I advise to consume protein rich foods such as lean meats; poultry; eggs; seafood; beans, peas, and lentils; nuts and seeds; and soy products. Fish and red meat are excellent sources of vitamin B12. I encourage Vitamin B12 [phone] supplements over the counter tablets once daily.

## 2023-04-09 LAB — VITAMIN D 25 HYDROXY (VIT D DEFICIENCY, FRACTURES): Vit D, 25-Hydroxy: 11.6 ng/mL — ABNORMAL LOW (ref 30.0–100.0)

## 2023-04-09 LAB — IRON,TIBC AND FERRITIN PANEL
Ferritin: 50 ng/mL (ref 15–77)
Iron Saturation: 57 % — ABNORMAL HIGH (ref 15–55)
Iron: 180 ug/dL — ABNORMAL HIGH (ref 27–159)
Total Iron Binding Capacity: 316 ug/dL (ref 250–450)
UIBC: 136 ug/dL (ref 131–425)

## 2023-04-09 LAB — VITAMIN B12: Vitamin B-12: 534 pg/mL (ref 232–1245)

## 2023-05-19 ENCOUNTER — Encounter: Payer: Self-pay | Admitting: Adult Health

## 2023-05-19 ENCOUNTER — Ambulatory Visit (INDEPENDENT_AMBULATORY_CARE_PROVIDER_SITE_OTHER): Payer: BC Managed Care – PPO | Admitting: Adult Health

## 2023-05-19 VITALS — BP 136/77 | HR 97 | Ht 65.0 in | Wt 235.0 lb

## 2023-05-19 DIAGNOSIS — Z3041 Encounter for surveillance of contraceptive pills: Secondary | ICD-10-CM

## 2023-05-19 DIAGNOSIS — N926 Irregular menstruation, unspecified: Secondary | ICD-10-CM | POA: Diagnosis not present

## 2023-05-19 MED ORDER — SLYND 4 MG PO TABS: 1.0000 | ORAL_TABLET | Freq: Every day | ORAL | Status: DC

## 2023-05-19 NOTE — Progress Notes (Signed)
  Subjective:     Patient ID: Katie Erickson, female   DOB: 2006/03/25, 18 y.o.   MRN: [id number]  HPI Martesha is a 18 year old black female,single, G0P0, in requesting to switch birth control, she is on Micronor and has irregular periods, will skip months. She actually wants a period. Her mom is with her. She had negative HPT.   PCP is I Polanco.  Review of Systems Skipping periods with Micronor Reviewed past medical,surgical, social and family history. Reviewed medications and allergies.     Objective:   Physical Exam BP 136/77 (BP Location: Left Arm, Patient Position: Sitting, Cuff Size: Normal)   Pulse 97   Ht 5\' 5"  (1.651 m)   Wt 235 lb (106.6 kg)   LMP 03/09/2023 (Approximate)   BMI 39.11 kg/m     Skin warm and dry.. Lungs: clear to ausculation bilaterally. Cardiovascular: regular rate and rhythm.   Upstream - 05/19/23 1112       Pregnancy Intention Screening   Does the patient want to become pregnant in the next year? No    Does the patient's partner want to become pregnant in the next year? No    Would the patient like to discuss contraceptive options today? Yes      Contraception Wrap Up   Current Method Oral Contraceptive    End Method Oral Contraceptive    Contraception Counseling Provided Yes    How was the end contraceptive method provided? Provided on site   3 packs given to start            Assessment:     1. Encounter for surveillance of contraceptive pills (Primary) Stop micronor Will give 3 packs of slynd to try, can start today and use back up birth control for 1 pack Meds ordered this encounter  Medications   Drospirenone (SLYND) 4 MG TABS    Sig: Take 1 tablet (4 mg total) by mouth daily.    Supervising Provider:   Duane Lope H [[id number]]     2. Irregular periods Skipping periods on Micronor    Plan:     Follow up in 10 weeks for ROS

## 2023-06-20 ENCOUNTER — Ambulatory Visit
Admission: EM | Admit: 2023-06-20 | Discharge: 2023-06-20 | Disposition: A | Discharge status: Home / Self Care | Payer: BC Managed Care – PPO

## 2023-06-20 DIAGNOSIS — R1030 Lower abdominal pain, unspecified: Secondary | ICD-10-CM | POA: Diagnosis not present

## 2023-06-20 DIAGNOSIS — K5900 Constipation, unspecified: Secondary | ICD-10-CM

## 2023-06-20 NOTE — Discharge Instructions (Signed)
Take MiraLAX at least several capfuls once to twice daily until bowel movements are soft and regular again.  You may add a Colace once or twice daily additionally.  Drink plenty of fluids, take a fiber supplement and eat a high-fiber diet.  Follow-up for worsening symptoms.

## 2023-06-20 NOTE — ED Triage Notes (Signed)
Pt report abdominal pain x 1 day, has been unable to make a BM, last BM was this morning there was very little in the stool, pain is across the center of the abdomen.  states she had to push hard for the BM to take place felt as if she was straining herself.

## 2023-06-24 NOTE — ED Provider Notes (Signed)
[facility]    CSN: [id number] Arrival date & time: 06/20/23  1753      History   Chief Complaint No chief complaint on file.   HPI Katie Erickson is a 18 y.o. female.   Patient presenting today with 1 day history of abdominal pain in the lower abdomen, bloating.  States she has been unable to have a bowel movement over the past few days and has been straining trying to have appointment with no success.  She denies fever, chills, nausea, vomiting, melena, hematemesis.  So for now try anything over-the-counter for symptoms.    Past Medical History:  Diagnosis Date   Anxiety    Asthma    Bronchitis    Obesity     Patient Active Problem List   Diagnosis Date Noted   Vitamin B12 deficiency 04/08/2023   Vitamin D deficiency 04/08/2023   Contact dermatitis and eczema 02/02/2023   Encounter for initial prescription of contraceptive pills 2023-01-01   Pregnancy examination or test, negative result 2023/01/01   History of chest pain 01/01/23   Family history of heart disease 2023/01/01   Family history of sudden cardiac death 01-01-2023   Chest pain 09-04-2022   Encounter for surveillance of contraceptive pills 05/31/2022   Screening examination for STD (sexually transmitted disease) 05/31/2022   Encounter for menstrual regulation 09/02/2020   Dysmenorrhea 09/02/2020   Irregular periods 09/02/2020   BMI (body mass index), pediatric, greater than or equal to 95% for age 82/22/2021   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 No pertinent surgical history.  OB History     Gravida  0   Para  0   Term  0   Preterm  0   AB  0   Living  0      SAB  0   IAB  0   Ectopic  0   Multiple  0   Live Births  0            Home Medications    Prior to Admission medications   Medication Sig Start Date End Date Taking? Authorizing Provider  cetirizine (ZYRTEC) 10 MG tablet Take 1 tablet (10 mg total) by  mouth daily. 02/23/22   Leath-Warren, Sadie Haber, NP  Drospirenone (SLYND) 4 MG TABS Take 1 tablet (4 mg total) by mouth daily. 05/19/23   Adline Potter, NP  fluticasone (FLONASE) 50 MCG/ACT nasal spray Place 2 sprays into both nostrils daily. 02/11/23   Leath-Warren, Sadie Haber, NP  ibuprofen (ADVIL) 800 MG tablet Take 1 tablet (800 mg total) by mouth every 6 (six) hours as needed for moderate pain. 10/07/22   Gilda Crease, MD  loperamide (IMODIUM) 2 MG capsule Take 1 capsule (2 mg total) by mouth 4 (four) times daily as needed for diarrhea or loose stools. 06/04/22   Leath-Warren, Sadie Haber, NP  loratadine (CLARITIN) 10 MG tablet TAKE 1 TABLET BY MOUTH ONCE DAILY. 08/08/20   Richrd Sox, MD  montelukast (SINGULAIR) 4 MG chewable tablet CHEW 1 TABLET BY MOUTH AT BEDTIME. 08/08/20   Richrd Sox, MD  triamcinolone cream (KENALOG) 0.1 % Apply topically 2 (two) times daily. 02/02/23   Del Nigel Berthold, FNP    Family History Family History  Problem Relation Age of Onset   Hypertension Paternal Grandfather    Hypertension Paternal Grandmother    Heart disease Paternal Grandmother    Kidney disease Paternal Grandmother  Hypertension Maternal Grandmother    Diabetes Maternal Grandmother    Bronchitis Mother    Diabetes Mother    Asthma Sister    Other Sister        cardiac arrest at age 92   Cancer Other        on both sides of family   Heart disease Other 34       had MI and died    Social History Social History   Tobacco Use   Smoking status: Never    Passive exposure: Yes   Smokeless tobacco: Never   Tobacco comments:    mom  Vaping Use   Vaping status: Never Used  Substance Use Topics   Alcohol use: Not Currently    Comment: once in a while   Drug use: Yes    Types: Marijuana    Comment: once in a while     Allergies   Cinnamon   Review of Systems Review of Systems Per HPI  Physical Exam Triage Vital Signs ED Triage Vitals  Encounter  Vitals Group     BP 06/20/23 1938 114/71     Systolic BP Percentile --      Diastolic BP Percentile --      Pulse Rate 06/20/23 1938 96     Resp 06/20/23 1938 18     Temp 06/20/23 1938 98.8 F (37.1 C)     Temp Source 06/20/23 1938 Oral     SpO2 06/20/23 1938 98 %     Weight --      Height --      Head Circumference --      Peak Flow --      Pain Score 06/20/23 1943 0     Pain Loc --      Pain Education --      Exclude from Growth Chart --    No data found.  Updated Vital Signs BP 114/71 (BP Location: Right Arm)   Pulse 96   Temp 98.8 F (37.1 C) (Oral)   Resp 18   SpO2 98%   Visual Acuity Right Eye Distance:   Left Eye Distance:   Bilateral Distance:    Right Eye Near:   Left Eye Near:    Bilateral Near:     Physical Exam Vitals and nursing note reviewed.  Constitutional:      Appearance: Normal appearance. She is not ill-appearing.  HENT:     Head: Atraumatic.     Mouth/Throat:     Mouth: Mucous membranes are moist.  Eyes:     Extraocular Movements: Extraocular movements intact.     Conjunctiva/sclera: Conjunctivae normal.  Cardiovascular:     Rate and Rhythm: Normal rate and regular rhythm.     Heart sounds: Normal heart sounds.  Pulmonary:     Effort: Pulmonary effort is normal.     Breath sounds: Normal breath sounds.  Abdominal:     General: Bowel sounds are normal. There is no distension.     Palpations: Abdomen is soft.     Tenderness: There is no abdominal tenderness. There is no right CVA tenderness, left CVA tenderness or guarding.  Musculoskeletal:        General: Normal range of motion.     Cervical back: Normal range of motion and neck supple.  Skin:    General: Skin is warm and dry.  Neurological:     Mental Status: She is alert and oriented to person, place, and time.  Psychiatric:  Mood and Affect: Mood normal.        Thought Content: Thought content normal.        Judgment: Judgment normal.      UC Treatments / Results   Labs (all labs ordered are listed, but only abnormal results are displayed) Labs Reviewed - No data to display  EKG   Radiology No results found.  Procedures Procedures (including critical care time)  Medications Ordered in UC Medications - No data to display  Initial Impression / Assessment and Plan / UC Course  I have reviewed the triage vital signs and the nursing notes.  Pertinent labs & imaging results that were available during my care of the patient were reviewed by me and considered in my medical decision making (see chart for details).     No red flag findings on exam today, vitals and exam very reassuring.  Suspect constipation causing her lower abdominal pain.  Discussed MiraLAX, Colace, supportive over-the-counter remedies.  Return for worsening symptoms.  Final Clinical Impressions(s) / UC Diagnoses   Final diagnoses:  Constipation, unspecified constipation type  Lower abdominal pain     Discharge Instructions      Take MiraLAX at least several capfuls once to twice daily until bowel movements are soft and regular again.  You may add a Colace once or twice daily additionally.  Drink plenty of fluids, take a fiber supplement and eat a high-fiber diet.  Follow-up for worsening symptoms.    ED Prescriptions   None    PDMP not reviewed this encounter.   Particia Nearing, [place] 06/24/23 1929

## 2023-07-05 DIAGNOSIS — A084 Viral intestinal infection, unspecified: Secondary | ICD-10-CM | POA: Diagnosis not present

## 2023-07-05 DIAGNOSIS — R030 Elevated blood-pressure reading, without diagnosis of hypertension: Secondary | ICD-10-CM | POA: Diagnosis not present

## 2023-07-28 ENCOUNTER — Other Ambulatory Visit: Payer: Self-pay | Admitting: Adult Health

## 2023-07-28 ENCOUNTER — Encounter: Payer: Self-pay | Admitting: Adult Health

## 2023-07-28 ENCOUNTER — Ambulatory Visit: Payer: BC Managed Care – PPO | Admitting: Adult Health

## 2023-07-28 VITALS — BP 106/71 | HR 94 | Ht 65.0 in | Wt 234.5 lb

## 2023-07-28 DIAGNOSIS — Z3041 Encounter for surveillance of contraceptive pills: Secondary | ICD-10-CM

## 2023-07-28 MED ORDER — SLYND 4 MG PO TABS: 1.0000 | ORAL_TABLET | Freq: Every day | ORAL | 4 refills | Status: DC

## 2023-07-28 NOTE — Progress Notes (Signed)
  Subjective:     Patient ID: Katie Erickson, female   DOB: [birth year], 18 y.o.   MRN: [id number]  HPI Katie Erickson is a 18 year old black female,single, G0P0, back in follow up on taking slynd and likes it, not having a period.  PCP is I Polanco.   Review of Systems No period on slynd Reviewed past medical,surgical, social and family history. Reviewed medications and allergies.     Objective:   Physical Exam BP 106/71 (BP Location: Right Arm, Patient Position: Sitting, Cuff Size: Large)   Pulse 94   Ht 5\' 5"  (1.651 m)   Wt 234 lb 8 oz (106.4 kg)   BMI 39.02 kg/m     Skin warm and dry. Lungs: clear to ausculation bilaterally. Cardiovascular: regular rate and rhythm.   Upstream - 07/28/23 0910       Pregnancy Intention Screening   Does the patient want to become pregnant in the next year? No    Does the patient's partner want to become pregnant in the next year? No    Would the patient like to discuss contraceptive options today? No      Contraception Wrap Up   Current Method Oral Contraceptive    End Method Oral Contraceptive    Contraception Counseling Provided Yes             Assessment:     1. Encounter for surveillance of contraceptive pills (Primary) Happy with Slynd, will refill, and gave 1 pack No periods on slynd Meds ordered this encounter  Medications   Drospirenone (SLYND) 4 MG TABS    Sig: Take 1 tablet (4 mg total) by mouth daily.    Dispense:  84 tablet    Refill:  4    Supervising Provider:   Lazaro Arms [[id number]]       Plan:     Follow up in 6 months or sooner if needed

## 2023-08-10 ENCOUNTER — Other Ambulatory Visit: Payer: Self-pay | Admitting: Adult Health

## 2023-08-10 MED ORDER — NORETHINDRONE 0.35 MG PO TABS: 1.0000 | ORAL_TABLET | Freq: Every day | ORAL | 11 refills | Status: DC

## 2023-08-10 NOTE — Progress Notes (Signed)
 Rx camilla

## 2023-09-15 ENCOUNTER — Ambulatory Visit: Admitting: Family Medicine

## 2023-09-15 VITALS — BP 122/76 | HR 87 | Ht 65.0 in | Wt 227.1 lb

## 2023-09-15 DIAGNOSIS — F4321 Adjustment disorder with depressed mood: Secondary | ICD-10-CM

## 2023-09-15 DIAGNOSIS — E559 Vitamin D deficiency, unspecified: Secondary | ICD-10-CM

## 2023-09-15 DIAGNOSIS — E038 Other specified hypothyroidism: Secondary | ICD-10-CM

## 2023-09-15 DIAGNOSIS — R7303 Prediabetes: Secondary | ICD-10-CM | POA: Diagnosis not present

## 2023-09-15 DIAGNOSIS — Z136 Encounter for screening for cardiovascular disorders: Secondary | ICD-10-CM | POA: Diagnosis not present

## 2023-09-15 NOTE — Assessment & Plan Note (Signed)
 Advise patient to engage in regular physical activity, maintain a consistent sleep schedule, and practice mindfulness or journaling to process grief. Staying connected with supportive friends or family members and setting small, manageable goals can also improve mood and motivation. Seeking therapy or support groups may provide additional coping strategies and validation  Referral placed to behavioral health for therapy   Patient verbally consented to [facility] services about presenting concerns and psychiatric consultation as appropriate. The services will be billed as appropriate for the patient.

## 2023-09-15 NOTE — Progress Notes (Signed)
 Established Patient Office Visit   Subjective  Patient ID: Katie Erickson, female    DOB: 09-Oct-2005  Age: 18 y.o. MRN: [id number]  Chief Complaint  Patient presents with   Medical Management of Chronic Issues    Mental health check  Pt., reports having lost a sibling, family issues w/ parental , trouble finding employment.     She  has a past medical history of Anxiety, Asthma, Bronchitis, and Obesity.  The patient reports ongoing symptoms of fatigue and sadness, which have been persistent and gradually worsening over time. This appears to be a recurrent issue. The onset of symptoms coincides with the loss of a sibling two years ago, which the patient is still actively grieving. Additionally, the patient reports a strained relationship with their father that began around the same time. Associated symptoms include low energy, poor concentration, feelings of helplessness and hopelessness, diminished interest in usual activities, and persistent low mood. There are no reports of headaches or suicidal ideation. The patient denies any history of suicide attempts. Symptoms are notably aggravated by ongoing family conflicts and psychosocial stressors, including financial difficulties. The patient has not received prior treatment for these symptoms. Past medical history is significant for depression. Risk factors include a major life event and current stressors.      Review of Systems  Constitutional:  Negative for chills and fever.  Respiratory:  Negative for shortness of breath.   Cardiovascular:  Negative for chest pain.  Neurological:  Negative for dizziness and headaches.  Psychiatric/Behavioral:  Negative for suicidal ideas.    Flowsheet Row Office Visit from 09/15/2023 in [facility]  PHQ-9 Total Score 2         Objective:     BP 122/76   Pulse 87   Ht 5\' 5"  (1.651 m)   Wt 227 lb 1.9 oz (103 kg)   LMP 08/25/2023 (Exact Date)   SpO2 97%   BMI 37.79  kg/m  BP Readings from Last 3 Encounters:  09/15/23 122/76  07/28/23 106/71  06/20/23 114/71      Physical Exam Vitals reviewed.  Constitutional:      General: She is not in acute distress.    Appearance: Normal appearance. She is not ill-appearing, toxic-appearing or diaphoretic.  HENT:     Head: Normocephalic.  Eyes:     General:        Right eye: No discharge.        Left eye: No discharge.     Conjunctiva/sclera: Conjunctivae normal.  Cardiovascular:     Rate and Rhythm: Normal rate.     Pulses: Normal pulses.     Heart sounds: Normal heart sounds.  Pulmonary:     Effort: Pulmonary effort is normal. No respiratory distress.     Breath sounds: Normal breath sounds.  Abdominal:     General: Bowel sounds are normal.     Palpations: Abdomen is soft.     Tenderness: There is no abdominal tenderness. There is no right CVA tenderness, left CVA tenderness or guarding.  Skin:    General: Skin is warm and dry.  Neurological:     Mental Status: She is alert.     Coordination: Coordination normal.     Gait: Gait normal.  Psychiatric:        Mood and Affect: Mood normal.        Behavior: Behavior normal.      No results found for any visits on 09/15/23.  The ASCVD  Risk score (Arnett DK, et al., 2019) failed to calculate for the following reasons:   The 2019 ASCVD risk score is only valid for ages 91 to 72    Assessment & Plan:  Grieving Assessment & Plan: Advise patient to engage in regular physical activity, maintain a consistent sleep schedule, and practice mindfulness or journaling to process grief. Staying connected with supportive friends or family members and setting small, manageable goals can also improve mood and motivation. Seeking therapy or support groups may provide additional coping strategies and validation  Referral placed to behavioral health for therapy   Patient verbally consented to [facility] services about presenting  concerns and psychiatric consultation as appropriate. The services will be billed as appropriate for the patient.    Orders: -     Ambulatory referral to Behavioral Health  Vitamin D  deficiency -     VITAMIN D  25 Hydroxy (Vit-D Deficiency, Fractures)  TSH (thyroid -stimulating hormone deficiency) -     TSH + free T4  Encounter for screening for cardiovascular disorders -     Lipid panel -     CMP14+EGFR -     CBC with Differential/Platelet  Prediabetes -     Hemoglobin A1c    Return in about 6 months (around 03/17/2024), or if symptoms worsen or fail to improve, for Follow up.   Avelino Lek Amber Bail, FNP

## 2023-09-15 NOTE — Patient Instructions (Signed)

## 2023-09-16 ENCOUNTER — Ambulatory Visit: Payer: Self-pay | Admitting: Family Medicine

## 2023-09-16 LAB — CBC WITH DIFFERENTIAL/PLATELET
Basophils Absolute: 0.1 10*3/uL (ref 0.0–0.2)
Basos: 1 %
EOS (ABSOLUTE): 0.1 10*3/uL (ref 0.0–0.4)
Eos: 2 %
Hematocrit: 39.4 % (ref 34.0–46.6)
Hemoglobin: 12.5 g/dL (ref 11.1–15.9)
Immature Grans (Abs): 0 10*3/uL (ref 0.0–0.1)
Immature Granulocytes: 0 %
Lymphocytes Absolute: 1.7 10*3/uL (ref 0.7–3.1)
Lymphs: 35 %
MCH: 29.3 pg (ref 26.6–33.0)
MCHC: 31.7 g/dL (ref 31.5–35.7)
MCV: 93 fL (ref 79–97)
Monocytes Absolute: 0.4 10*3/uL (ref 0.1–0.9)
Monocytes: 8 %
Neutrophils Absolute: 2.6 10*3/uL (ref 1.4–7.0)
Neutrophils: 54 %
Platelets: 353 10*3/uL (ref 150–450)
RBC: 4.26 x10E6/uL (ref 3.77–5.28)
RDW: 12.7 % (ref 11.7–15.4)
WBC: 4.9 10*3/uL (ref 3.4–10.8)

## 2023-09-16 LAB — TSH+FREE T4
Free T4: 1.08 ng/dL (ref 0.93–1.60)
TSH: 2.07 u[IU]/mL (ref 0.450–4.500)

## 2023-09-16 LAB — CMP14+EGFR
ALT: 34 IU/L — ABNORMAL HIGH (ref 0–32)
AST: 23 IU/L (ref 0–40)
Albumin: 4.3 g/dL (ref 4.0–5.0)
Alkaline Phosphatase: 82 IU/L (ref 42–106)
BUN/Creatinine Ratio: 13 (ref 9–23)
BUN: 10 mg/dL (ref 6–20)
Bilirubin Total: 0.2 mg/dL (ref 0.0–1.2)
CO2: 25 mmol/L (ref 20–29)
Calcium: 9.7 mg/dL (ref 8.7–10.2)
Chloride: 101 mmol/L (ref 96–106)
Creatinine, Ser: 0.79 mg/dL (ref 0.57–1.00)
Globulin, Total: 3.1 g/dL (ref 1.5–4.5)
Glucose: 81 mg/dL (ref 70–99)
Potassium: 4.1 mmol/L (ref 3.5–5.2)
Sodium: 140 mmol/L (ref 134–144)
Total Protein: 7.4 g/dL (ref 6.0–8.5)
eGFR: 110 mL/min/{1.73_m2} (ref >59)

## 2023-09-16 LAB — LIPID PANEL
Chol/HDL Ratio: 4 ratio (ref 0.0–4.4)
Cholesterol, Total: 183 mg/dL — ABNORMAL HIGH (ref 100–169)
HDL: 46 mg/dL (ref >39)
LDL Chol Calc (NIH): 126 mg/dL — ABNORMAL HIGH (ref 0–109)
Triglycerides: 60 mg/dL (ref 0–89)
VLDL Cholesterol Cal: 11 mg/dL (ref 5–40)

## 2023-09-16 LAB — HEMOGLOBIN A1C
Est. average glucose Bld gHb Est-mCnc: 108 mg/dL
Hgb A1c MFr Bld: 5.4 % (ref 4.8–5.6)

## 2023-09-16 LAB — VITAMIN D 25 HYDROXY (VIT D DEFICIENCY, FRACTURES): Vit D, 25-Hydroxy: 13.5 ng/mL — ABNORMAL LOW (ref 30.0–100.0)

## 2023-09-28 ENCOUNTER — Ambulatory Visit: Admitting: Adult Health

## 2023-09-28 ENCOUNTER — Encounter: Payer: Self-pay | Admitting: Adult Health

## 2023-09-28 VITALS — BP 123/77 | HR 103 | Ht 65.0 in | Wt 234.0 lb

## 2023-09-28 DIAGNOSIS — Z3202 Encounter for pregnancy test, result negative: Secondary | ICD-10-CM | POA: Diagnosis not present

## 2023-09-28 DIAGNOSIS — Z30017 Encounter for initial prescription of implantable subdermal contraceptive: Secondary | ICD-10-CM | POA: Diagnosis not present

## 2023-09-28 LAB — POCT URINE PREGNANCY: Preg Test, Ur: NEGATIVE

## 2023-09-28 MED ORDER — ETONOGESTREL 68 MG ~~LOC~~ IMPL
68.0000 mg | DRUG_IMPLANT | Freq: Once | SUBCUTANEOUS | Status: AC
  Administered 2023-09-28: 68 mg via SUBCUTANEOUS

## 2023-09-28 NOTE — Patient Instructions (Signed)
Use condoms x 2 weeks, keep clean and dry x 24 hours, no heavy lifting, keep steri strips on x 72 hours, Keep pressure dressing on x 24 hours. Follow up prn problems.  

## 2023-09-28 NOTE — Progress Notes (Signed)
  Subjective:     Patient ID: Katie Erickson, female   DOB: Aug 16, 2005, 18 y.o.   MRN: [id number]  HPI Katie Erickson is a 18 year old black female,single, G0P0, in request nexplanon insertion.  PCP is I Polanco NP  Review of Systems For nexplanon insertion Last sex about 2 weeks ago On period now Denies MI,stroke, DVT, breast cancer or migraine with aura  Reviewed past medical,surgical, social and family history. Reviewed medications and allergies.     Objective:   Physical Exam BP 123/77 (BP Location: Left Arm, Patient Position: Sitting, Cuff Size: Large)   Pulse (!) 103   Ht 5\' 5"  (1.651 m)   Wt 234 lb (106.1 kg)   LMP 09/27/2023 (Exact Date)   BMI 38.94 kg/m  UPT is negative  Consent signed, time out called. Left arm cleansed with betadine, and injected with 1.5 cc 2% lidocaine  and waited til numb. Nexplanon easily inserted and steri strips applied.Rod easily palpated by provider and pt. Pressure dressing applied.   Upstream - 09/28/23 1208       Pregnancy Intention Screening   Does the patient want to become pregnant in the next year? No    Does the patient's partner want to become pregnant in the next year? No    Would the patient like to discuss contraceptive options today? No      Contraception Wrap Up   Current Method Female Condom    End Method Hormonal Implant    Contraception Counseling Provided Yes    How was the end contraceptive method provided? Provided on site                Assessment:     1. Pregnancy examination or test, negative result - POCT urine pregnancy  2. Nexplanon insertion (Primary) Rod inserted left arm Lot [id number] 2027-06    Use condoms x 2 weeks, keep clean and dry x 24 hours, no heavy lifting, keep steri strips on x 72 hours, Keep pressure dressing on x 24 hours. Follow up prn problems.  Plan:     Remove rod in 3 years or sooner if desired

## 2023-09-28 NOTE — Addendum Note (Signed)
 Addended by: Letha Rav on: 09/28/2023 01:14 PM   Modules accepted: Orders

## 2023-10-27 ENCOUNTER — Ambulatory Visit: Admitting: Family Medicine

## 2023-12-08 ENCOUNTER — Ambulatory Visit (INDEPENDENT_AMBULATORY_CARE_PROVIDER_SITE_OTHER): Admitting: Adult Health

## 2023-12-08 ENCOUNTER — Encounter: Payer: Self-pay | Admitting: Adult Health

## 2023-12-08 ENCOUNTER — Other Ambulatory Visit (HOSPITAL_COMMUNITY)
Admission: RE | Admit: 2023-12-08 | Discharge: 2023-12-08 | Disposition: A | Discharge status: Home / Self Care | Source: Ambulatory Visit | Attending: Adult Health | Admitting: Adult Health

## 2023-12-08 VITALS — BP 114/75 | HR 80 | Ht 65.0 in | Wt 224.0 lb

## 2023-12-08 DIAGNOSIS — N921 Excessive and frequent menstruation with irregular cycle: Secondary | ICD-10-CM | POA: Diagnosis not present

## 2023-12-08 DIAGNOSIS — N898 Other specified noninflammatory disorders of vagina: Secondary | ICD-10-CM | POA: Insufficient documentation

## 2023-12-08 DIAGNOSIS — Z975 Presence of (intrauterine) contraceptive device: Secondary | ICD-10-CM | POA: Diagnosis not present

## 2023-12-08 NOTE — Progress Notes (Signed)
  Subjective:     Patient ID: Katie Erickson, female   DOB: [birth year], 18 y.o.   MRN: [id number]  HPI Katie Erickson is a 18 year old black female,with SO, G0P0, in complaining of spotting on and off with nexplanon  and vaginal itching ? Yeast, used monistat and it stopped.  PCP is I Polanco NP  Review of Systems spotting on and off with nexplanon   Some cramping vaginal itching ? Yeast, used monistat and it stopped.   No pain with sex, no sex in about 2 months  Reviewed past medical,surgical, social and family history. Reviewed medications and allergies.  Objective:   Physical Exam BP 114/75 (BP Location: Right Arm, Patient Position: Sitting, Cuff Size: Large)   Pulse 80   Ht 5' 5 (1.651 m)   Wt 224 lb (101.6 kg)   BMI 37.28 kg/m     Skin warm and dry.Pelvic: external genitalia is normal in appearance no lesions, vagina: pink discharge, CV swab sent,urethra has no lesions or masses noted, cervix:smooth, uterus: normal size, shape and contour, non tender, no masses felt, adnexa: no masses or tenderness noted. Bladder is non tender and no masses felt.   Upstream - 12/08/23 1207       Pregnancy Intention Screening   Does the patient want to become pregnant in the next year? No    Does the patient's partner want to become pregnant in the next year? No    Would the patient like to discuss contraceptive options today? No      Contraception Wrap Up   Current Method Hormonal Implant    End Method Hormonal Implant    Contraception Counseling Provided Yes         Examination chaperoned by Clarita Salt LPN  Assessment:     1. Irregular intermenstrual bleeding (Primary) spotting on and off with nexplanon   Will just watch for now CV swab sent for GC/CHL,trich,BV and yeast  - Cervicovaginal ancillary only( [facility])  2. Nexplanon  in place Placed 09/28/23  3. Vaginal itching vaginal itching ? Yeast, used monistat and it stopped. CV swab sent  - Cervicovaginal ancillary only( [facility])     Plan:     Follow up prn

## 2023-12-09 LAB — CERVICOVAGINAL ANCILLARY ONLY
Bacterial Vaginitis (gardnerella): POSITIVE — AB
Candida Glabrata: NEGATIVE
Candida Vaginitis: NEGATIVE
Chlamydia: NEGATIVE
Comment: NEGATIVE
Comment: NEGATIVE
Comment: NEGATIVE
Comment: NEGATIVE
Comment: NEGATIVE
Comment: NORMAL
Neisseria Gonorrhea: NEGATIVE
Trichomonas: POSITIVE — AB

## 2023-12-12 ENCOUNTER — Ambulatory Visit: Payer: Self-pay | Admitting: Adult Health

## 2023-12-12 DIAGNOSIS — A599 Trichomoniasis, unspecified: Secondary | ICD-10-CM | POA: Insufficient documentation

## 2023-12-12 MED ORDER — METRONIDAZOLE 500 MG PO TABS
500.0000 mg | ORAL_TABLET | Freq: Two times a day (BID) | ORAL | 0 refills | Status: DC
Start: 2023-12-12 — End: 2024-01-06

## 2024-01-04 ENCOUNTER — Other Ambulatory Visit

## 2024-01-04 ENCOUNTER — Other Ambulatory Visit (HOSPITAL_COMMUNITY)
Admission: RE | Admit: 2024-01-04 | Discharge: 2024-01-04 | Disposition: A | Discharge status: Home / Self Care | Source: Ambulatory Visit | Attending: Obstetrics & Gynecology | Admitting: Obstetrics & Gynecology

## 2024-01-04 DIAGNOSIS — A599 Trichomoniasis, unspecified: Secondary | ICD-10-CM | POA: Insufficient documentation

## 2024-01-04 NOTE — Progress Notes (Addendum)
   NURSE VISIT- POC  SUBJECTIVE:  Katie Erickson is a 18 y.o. G0P0000 GYN patientfemale here for a vaginal swab for proof of cure after treatment for Trichomonas.  She reports the following symptoms: none for 0 days. Denies abnormal vaginal bleeding, significant pelvic pain, fever, or UTI symptoms.  OBJECTIVE:  There were no vitals taken for this visit.  Appears well, in no apparent distress  ASSESSMENT: Vaginal swab for proof of cure after treatment for Trichomonas  PLAN: Self-collected vaginal probe for Trichomonas sent to lab Treatment: to be determined once results are received Follow-up as needed if symptoms persist/worsen, or new symptoms develop  Aleck FORBES Blase  01/04/2024 10:49 AM

## 2024-01-05 LAB — CERVICOVAGINAL ANCILLARY ONLY
Comment: NEGATIVE
Trichomonas: POSITIVE — AB

## 2024-01-06 ENCOUNTER — Ambulatory Visit: Payer: Self-pay | Admitting: Adult Health

## 2024-01-06 DIAGNOSIS — A599 Trichomoniasis, unspecified: Secondary | ICD-10-CM

## 2024-01-06 MED ORDER — METRONIDAZOLE 500 MG PO TABS: 500.0000 mg | ORAL_TABLET | Freq: Two times a day (BID) | ORAL | 0 refills | Status: DC

## 2024-01-19 ENCOUNTER — Encounter: Payer: Self-pay | Admitting: Family Medicine

## 2024-01-19 ENCOUNTER — Encounter: Payer: Self-pay | Admitting: Emergency Medicine

## 2024-01-19 ENCOUNTER — Ambulatory Visit
Admission: EM | Admit: 2024-01-19 | Discharge: 2024-01-19 | Disposition: A | Discharge status: Home / Self Care | Attending: Nurse Practitioner | Admitting: Nurse Practitioner

## 2024-01-19 DIAGNOSIS — R591 Generalized enlarged lymph nodes: Secondary | ICD-10-CM | POA: Diagnosis not present

## 2024-01-19 LAB — POCT MONO SCREEN (KUC): Mono, POC: NEGATIVE

## 2024-01-19 NOTE — ED Provider Notes (Signed)
 [facility]    CSN: [id number] Arrival date & time: 01/19/24  1658      History   Chief Complaint No chief complaint on file.   HPI Katie Erickson is a 18 y.o. female.   The history is provided by the patient.   Patient presents for complaints of a knot on the right side of her neck.  Patient states symptoms started today.  She denies fever, chills, sore throat, pain to the site, fatigue, ear pain, abdominal pain, nausea, vomiting, or diarrhea.  Patient denies any obvious close sick contacts.  So far, she has not tried any medications for her symptoms.  Past Medical History:  Diagnosis Date   Anxiety    Asthma    Bronchitis    Obesity     Patient Active Problem List   Diagnosis Date Noted   Trichomonas infection 12/12/2023   Nexplanon  in place 12/08/2023   Irregular intermenstrual bleeding 12/08/2023   Nexplanon  insertion 09/28/2023   Grieving 09/15/2023   Vitamin B12 deficiency 04/08/2023   Vitamin D  deficiency 04/08/2023   Contact dermatitis and eczema 02/02/2023   Encounter for initial prescription of contraceptive pills 2022-12-26   Pregnancy examination or test, negative result 12-26-22   History of chest pain Dec 26, 2022   Family history of heart disease 2022-12-26   Family history of sudden cardiac death 2022-12-26   Chest pain Aug 29, 2022   Encounter for surveillance of contraceptive pills 05/31/2022   Screening examination for STD (sexually transmitted disease) 05/31/2022   Encounter for menstrual regulation 09/02/2020   Dysmenorrhea 09/02/2020   Irregular periods 09/02/2020   BMI (body mass index), pediatric, 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 No pertinent surgical history.  OB History     Gravida  0   Para  0   Term  0   Preterm  0   AB  0   Living  0      SAB  0   IAB  0   Ectopic  0    Multiple  0   Live Births  0            Home Medications    Prior to Admission medications   Medication Sig Start Date End Date Taking? Authorizing Provider  APPLE CIDER VINEGAR PO Take by mouth.    [provider]  cetirizine  (ZYRTEC ) 10 MG tablet Take 1 tablet (10 mg total) by mouth daily. 02/23/22   Leath-Warren, Etta PARAS, NP  etonogestrel  (NEXPLANON ) 68 MG IMPL implant 1 each by Subdermal route once.    [provider]  fluticasone  (FLONASE ) 50 MCG/ACT nasal spray Place 2 sprays into both nostrils daily. 02/11/23   Leath-Warren, Etta PARAS, NP  ibuprofen  (ADVIL ) 800 MG tablet Take 1 tablet (800 mg total) by mouth every 6 (six) hours as needed for moderate pain. 10/07/22   Haze Lonni PARAS, MD  loperamide  (IMODIUM ) 2 MG capsule Take 1 capsule (2 mg total) by mouth 4 (four) times daily as needed for diarrhea or loose stools. 06/04/22   Leath-Warren, Etta PARAS, NP  loratadine  (CLARITIN ) 10 MG tablet TAKE 1 TABLET BY MOUTH ONCE DAILY. 08/08/20   Vicci Raiford DASEN, MD  montelukast  (SINGULAIR ) 4 MG chewable tablet CHEW 1 TABLET BY MOUTH AT BEDTIME. 08/08/20   Vicci Raiford DASEN, MD  triamcinolone  cream (KENALOG ) 0.1 % Apply topically 2 (two)  times daily. 02/02/23   Del Wilhelmena Lloyd Sola, FNP    Family History Family History  Problem Relation Age of Onset   Hypertension Paternal Grandfather    Hypertension Paternal Grandmother    Heart disease Paternal Grandmother    Kidney disease Paternal Grandmother    Hypertension Maternal Grandmother    Diabetes Maternal Grandmother    Bronchitis Mother    Diabetes Mother    Asthma Sister    Other Sister        cardiac arrest at age 57   Cancer Other        on both sides of family   Heart disease Other 107       had MI and died    Social History Social History   Tobacco Use   Smoking status: Never    Passive exposure: Yes   Smokeless tobacco: Never   Tobacco comments:    mom  Vaping Use   Vaping status: Never  Used  Substance Use Topics   Alcohol use: Not Currently    Comment: once in a while   Drug use: Yes    Types: Marijuana    Comment: 1-2 times a week     Allergies   Cinnamon   Review of Systems Review of Systems Per HPI  Physical Exam Triage Vital Signs ED Triage Vitals  Encounter Vitals Group     BP 01/19/24 1701 110/65     Girls Systolic BP Percentile --      Girls Diastolic BP Percentile --      Boys Systolic BP Percentile --      Boys Diastolic BP Percentile --      Pulse Rate 01/19/24 1701 98     Resp 01/19/24 1701 18     Temp 01/19/24 1701 99 F (37.2 C)     Temp Source 01/19/24 1701 Oral     SpO2 01/19/24 1701 99 %     Weight --      Height --      Head Circumference --      Peak Flow --      Pain Score 01/19/24 1702 0     Pain Loc --      Pain Education --      Exclude from Growth Chart --    No data found.  Updated Vital Signs BP 110/65 (BP Location: Right Arm)   Pulse 98   Temp 99 F (37.2 C) (Oral)   Resp 18   SpO2 99%   Visual Acuity Right Eye Distance:   Left Eye Distance:   Bilateral Distance:    Right Eye Near:   Left Eye Near:    Bilateral Near:     Physical Exam Vitals and nursing note reviewed.  Constitutional:      Appearance: Normal appearance.  HENT:     Head: Normocephalic.     Right Ear: Tympanic membrane, ear canal and external ear normal.     Left Ear: Tympanic membrane, ear canal and external ear normal.     Nose: Nose normal.     Mouth/Throat:     Mouth: Mucous membranes are moist.  Eyes:     Extraocular Movements: Extraocular movements intact.     Conjunctiva/sclera: Conjunctivae normal.     Pupils: Pupils are equal, round, and reactive to light.  Cardiovascular:     Rate and Rhythm: Normal rate and regular rhythm.     Pulses: Normal pulses.     Heart sounds: Normal heart  sounds.  Pulmonary:     Effort: Pulmonary effort is normal. No respiratory distress.     Breath sounds: Normal breath sounds. No stridor.  No wheezing, rhonchi or rales.  Abdominal:     General: Bowel sounds are normal.     Palpations: Abdomen is soft.  Musculoskeletal:     Cervical back: Normal range of motion.  Lymphadenopathy:     Cervical: Cervical adenopathy present.     Right cervical: Superficial cervical adenopathy present.  Skin:    General: Skin is warm and dry.  Neurological:     General: No focal deficit present.     Mental Status: She is alert and oriented to person, place, and time.  Psychiatric:        Mood and Affect: Mood normal.        Behavior: Behavior normal.      UC Treatments / Results  Labs (all labs ordered are listed, but only abnormal results are displayed) Labs Reviewed  POCT MONO SCREEN [facility]) - Normal    EKG   Radiology No results found.  Procedures Procedures (including critical care time)  Medications Ordered in UC Medications - No data to display  Initial Impression / Assessment and Plan / UC Course  I have reviewed the triage vital signs and the nursing notes.  Pertinent labs & imaging results that were available during my care of the patient were reviewed by me and considered in my medical decision making (see chart for details).  Monotest is negative.  Difficult to determine the cause of the patient's lymphadenopathy.  Symptoms started today.  Will have patient continue to monitor her symptoms for worsening.  Supportive care recommendations were provided and discussed with the patient to include fluids, rest, over-the-counter analgesics, and warm compresses to the site.  Discussed indications with patient regarding follow-up.  Patient was in agreement with this plan of care and verbalizes understanding.  All questions were answered.  Patient stable for discharge.   Final Clinical Impressions(s) / UC Diagnoses   Final diagnoses:  Lymphadenopathy     Discharge Instructions      Your monotest was negative. You may take over-the-counter Tylenol  or ibuprofen  as  needed for pain or discomfort. Apply warm compresses to the area while symptoms persist. Continue to monitor the area for worsening.  Follow-up with your primary care physician if you experience increased swelling, pain, or failure for the area to improve after 2 to 3 weeks. Follow-up as needed.     ED Prescriptions   None    PDMP not reviewed this encounter.   Gilmer Etta PARAS, NP 01/19/24 1736

## 2024-01-19 NOTE — ED Triage Notes (Signed)
 Notice knot on right side of neck today.

## 2024-01-19 NOTE — Discharge Instructions (Addendum)
 Your monotest was negative. You may take over-the-counter Tylenol  or ibuprofen  as needed for pain or discomfort. Apply warm compresses to the area while symptoms persist. Continue to monitor the area for worsening.  Follow-up with your primary care physician if you experience increased swelling, pain, or failure for the area to improve after 2 to 3 weeks. Follow-up as needed.

## 2024-01-19 NOTE — Telephone Encounter (Signed)
Definitely needs an office visit

## 2024-01-20 ENCOUNTER — Encounter: Payer: Self-pay | Admitting: *Deleted

## 2024-02-07 ENCOUNTER — Ambulatory Visit (INDEPENDENT_AMBULATORY_CARE_PROVIDER_SITE_OTHER): Admitting: *Deleted

## 2024-02-07 ENCOUNTER — Other Ambulatory Visit (HOSPITAL_COMMUNITY)
Admission: RE | Admit: 2024-02-07 | Discharge: 2024-02-07 | Disposition: A | Discharge status: Home / Self Care | Source: Ambulatory Visit | Attending: Obstetrics & Gynecology | Admitting: Obstetrics & Gynecology

## 2024-02-07 DIAGNOSIS — Z8619 Personal history of other infectious and parasitic diseases: Secondary | ICD-10-CM | POA: Insufficient documentation

## 2024-02-07 DIAGNOSIS — Z09 Encounter for follow-up examination after completed treatment for conditions other than malignant neoplasm: Secondary | ICD-10-CM | POA: Diagnosis not present

## 2024-02-07 NOTE — Progress Notes (Signed)
   NURSE VISIT- VAGINITIS/STD/POC  SUBJECTIVE:  Katie Erickson is a 18 y.o. G0P0000 GYN patientfemale here for a vaginal swab for proof of cure after treatment for Trichomonas.  She reports the following symptoms: none for 0 days. Denies abnormal vaginal bleeding, significant pelvic pain, fever, or UTI symptoms.  OBJECTIVE:  There were no vitals taken for this visit.  Appears well, in no apparent distress  ASSESSMENT: Vaginal swab for proof of cure after treatment for trichomonas  PLAN: Self-collected vaginal probe for Trichomonas sent to lab Treatment: to be determined once results are received Follow-up as needed if symptoms persist/worsen, or new symptoms develop  Alan LITTIE Fischer  02/07/2024 10:19 AM

## 2024-02-08 ENCOUNTER — Ambulatory Visit: Payer: Self-pay | Admitting: Adult Health

## 2024-02-08 LAB — CERVICOVAGINAL ANCILLARY ONLY
Comment: NEGATIVE
Trichomonas: NEGATIVE

## 2024-03-19 ENCOUNTER — Ambulatory Visit: Admitting: Family Medicine

## 2024-03-19 VITALS — BP 109/76 | HR 86 | Ht 65.0 in | Wt 232.0 lb

## 2024-03-19 DIAGNOSIS — F4321 Adjustment disorder with depressed mood: Secondary | ICD-10-CM | POA: Diagnosis not present

## 2024-03-19 NOTE — Patient Instructions (Addendum)
 I appreciate the opportunity to provide care to you today!    Follow up:  6 months  Labs: next visit  For a Healthier YOU, I Recommend: Reducing your intake of sugar, sodium, carbohydrates, and saturated fats. Increasing your fiber intake by incorporating more whole grains, fruits, and vegetables into your meals. Setting healthy goals with a focus on lowering your consumption of carbs, sugar, and unhealthy fats. Adding variety to your diet by including a wide range of fruits and vegetables. Cutting back on soda and limiting processed foods as much as possible. Staying active: In addition to taking your weight loss medication, aim for at least 150 minutes of moderate-intensity physical activity each week for optimal results.   Referrals today-  Integrated behavioral health for talk therapy     Please continue to a heart-healthy diet and increase your physical activities. Try to exercise for at least five days a week.    It was a pleasure to see you and I look forward to continuing to work together on your health and well-being. Please do not hesitate to call the office if you need care or have questions about your care.  In case of emergency, please visit the Emergency Department for urgent care, or contact our clinic at [phone] to schedule an appointment. We're here to help you!   Have a wonderful day and week. With Gratitude, Meade JENEANE Gerlach MSN, FNP-BC, PMHNP-BC

## 2024-03-19 NOTE — Progress Notes (Signed)
 Established Patient Office Visit  Subjective:  Patient ID: Katie Erickson, female    DOB: [birth year]  Age: 18 y.o. MRN: [id number]  CC:  Chief Complaint  Patient presents with   Care Management    Six month follow up   Depression    Episodes of depression, discuss low dose anti depressants    HPI Katie Erickson is a 18 y.o. female with past medical history of  Grieving presents for f/u of  chronic medical conditions. For the details of today's visit, please refer to the assessment and plan.    Past Medical History:  Diagnosis Date   Anxiety    Asthma    Bronchitis    Obesity     No past surgical history on file.  Family History  Problem Relation Age of Onset   Hypertension Paternal Grandfather    Hypertension Paternal Grandmother    Heart disease Paternal Grandmother    Kidney disease Paternal Grandmother    Hypertension Maternal Grandmother    Diabetes Maternal Grandmother    Bronchitis Mother    Diabetes Mother    Asthma Sister    Other Sister        cardiac arrest at age 61   Cancer Other        on both sides of family   Heart disease Other 62       had MI and died    Social History   Socioeconomic History   Marital status: Significant Other    Spouse name: Not on file   Number of children: Not on file   Years of education: Not on file   Highest education level: Associate degree: academic program  Occupational History   Not on file  Tobacco Use   Smoking status: Never    Passive exposure: Yes   Smokeless tobacco: Never   Tobacco comments:    mom  Vaping Use   Vaping status: Never Used  Substance and Sexual Activity   Alcohol use: Not Currently    Comment: once in a while   Drug use: Yes    Types: Marijuana    Comment: 1-2 times a week   Sexual activity: Yes    Birth control/protection: Condom, Implant  Other Topics Concern   Not on file  Social History Narrative   Parents have joint custody    Mom smokes   Social Drivers of Manufacturing Engineer Strain: Low Risk  (03/19/2024)   Overall Financial Resource Strain (CARDIA)    Difficulty of Paying Living Expenses: Not very hard  Food Insecurity: No Food Insecurity (03/19/2024)   Hunger Vital Sign    Worried About Running Out of Food in the Last Year: Never true    Ran Out of Food in the Last Year: Never true  Transportation Needs: No Transportation Needs (03/19/2024)   PRAPARE - Administrator, Civil Service (Medical): No    Lack of Transportation (Non-Medical): No  Physical Activity: Insufficiently Active (03/19/2024)   Exercise Vital Sign    Days of Exercise per Week: 2 days    Minutes of Exercise per Session: 20 min  Stress: No Stress Concern Present (03/19/2024)   [employer] of Occupational Health - Occupational Stress Questionnaire    Feeling of Stress: Only a little  Social Connections: Moderately Integrated (03/19/2024)   Social Connection and Isolation Panel    Frequency of Communication with Friends and Family: More than three times a week  Frequency of Social Gatherings with Friends and Family: More than three times a week    Attends Religious Services: 1 to 4 times per year    Active Member of [employer] or Organizations: Yes    Attends Engineer, Structural: More than 4 times per year    Marital Status: Never married  Intimate Partner Violence: Not At Risk (05/31/2022)   Humiliation, Afraid, Rape, and Kick questionnaire    Fear of Current or Ex-Partner: No    Emotionally Abused: No    Physically Abused: No    Sexually Abused: No    Outpatient Medications Prior to Visit  Medication Sig Dispense Refill   APPLE CIDER VINEGAR PO Take by mouth.     cetirizine  (ZYRTEC ) 10 MG tablet Take 1 tablet (10 mg total) by mouth daily. 30 tablet 0   etonogestrel  (NEXPLANON ) 68 MG IMPL implant 1 each by Subdermal route once.     fluticasone  (FLONASE ) 50 MCG/ACT nasal spray Place 2 sprays into both nostrils daily. 16 g 0   ibuprofen   (ADVIL ) 800 MG tablet Take 1 tablet (800 mg total) by mouth every 6 (six) hours as needed for moderate pain. 20 tablet 0   loperamide  (IMODIUM ) 2 MG capsule Take 1 capsule (2 mg total) by mouth 4 (four) times daily as needed for diarrhea or loose stools. 12 capsule 0   loratadine  (CLARITIN ) 10 MG tablet TAKE 1 TABLET BY MOUTH ONCE DAILY. 30 tablet 0   montelukast  (SINGULAIR ) 4 MG chewable tablet CHEW 1 TABLET BY MOUTH AT BEDTIME. 30 tablet 0   triamcinolone  cream (KENALOG ) 0.1 % Apply topically 2 (two) times daily. 80 g 3   No facility-administered medications prior to visit.    Allergies  Allergen Reactions   Cinnamon     ROS Review of Systems    Objective:    Physical Exam  BP 109/76   Pulse 86   Ht 5' 5 (1.651 m)   Wt 232 lb (105.2 kg)   SpO2 97%   BMI 38.61 kg/m  Wt Readings from Last 3 Encounters:  03/19/24 232 lb (105.2 kg)  12/08/23 224 lb (101.6 kg) (99%, Z= 2.26)*  09/28/23 234 lb (106.1 kg) (>99%, Z= 2.36)*   * Growth percentiles are based on CDC (Girls, 2-20 Years) data.    Lab Results  Component Value Date   TSH 2.070 09/15/2023   Lab Results  Component Value Date   WBC 4.9 09/15/2023   HGB 12.5 09/15/2023   HCT 39.4 09/15/2023   MCV 93 09/15/2023   PLT 353 09/15/2023   Lab Results  Component Value Date   NA 140 09/15/2023   K 4.1 09/15/2023   CO2 25 09/15/2023   GLUCOSE 81 09/15/2023   BUN 10 09/15/2023   CREATININE 0.79 09/15/2023   BILITOT 0.2 09/15/2023   ALKPHOS 82 09/15/2023   AST 23 09/15/2023   ALT 34 (H) 09/15/2023   PROT 7.4 09/15/2023   ALBUMIN 4.3 09/15/2023   CALCIUM 9.7 09/15/2023   ANIONGAP 9 10/06/2022   EGFR 110 09/15/2023   Lab Results  Component Value Date   CHOL 183 (H) 09/15/2023   Lab Results  Component Value Date   HDL 46 09/15/2023   Lab Results  Component Value Date   LDLCALC 126 (H) 09/15/2023   Lab Results  Component Value Date   TRIG 60 09/15/2023   Lab Results  Component Value Date   CHOLHDL  4.0 09/15/2023   Lab Results  Component Value  Date   HGBA1C 5.4 09/15/2023      Assessment & Plan:  Grieving Assessment & Plan: The patient presents today expressing increased emotional distress related to grief. She shares that around this time of year, her sister passed away, which continues to affect her deeply. She also reports strained family relationships, noting that she and her father are no longer speaking, and she misses her younger sister greatly. She denies any suicidal ideation, homicidal ideation, or auditory/visual hallucinations. No other acute concerns reported.  Patient and/or legal guardian verbally consented to [facility] services about presenting concerns and psychiatric consultation as appropriate.  The services will be billed as appropriate for the patient   Orders: -     Amb ref to Integrated Behavioral [facility]  Note: This chart has been completed using Engineer, Civil (consulting) software, and while attempts have been made to ensure accuracy, certain words and phrases may not be transcribed as intended.    Follow-up: Return in about 6 months (around 09/16/2024).   Leolia Vinzant  Z Bacchus, FNP

## 2024-03-19 NOTE — Assessment & Plan Note (Signed)
 The patient presents today expressing increased emotional distress related to grief. She shares that around this time of year, her sister passed away, which continues to affect her deeply. She also reports strained family relationships, noting that she and her father are no longer speaking, and she misses her younger sister greatly. She denies any suicidal ideation, homicidal ideation, or auditory/visual hallucinations. No other acute concerns reported.  Patient and/or legal guardian verbally consented to [facility] services about presenting concerns and psychiatric consultation as appropriate.  The services will be billed as appropriate for the patient

## 2024-03-21 ENCOUNTER — Encounter: Payer: Self-pay | Admitting: Emergency Medicine

## 2024-03-21 ENCOUNTER — Ambulatory Visit
Admission: EM | Admit: 2024-03-21 | Discharge: 2024-03-21 | Disposition: A | Discharge status: Home / Self Care | Attending: Family Medicine | Admitting: Family Medicine

## 2024-03-21 DIAGNOSIS — J069 Acute upper respiratory infection, unspecified: Secondary | ICD-10-CM | POA: Diagnosis not present

## 2024-03-21 DIAGNOSIS — R110 Nausea: Secondary | ICD-10-CM

## 2024-03-21 LAB — POC COVID19/FLU A&B COMBO
Covid Antigen, POC: NEGATIVE
Influenza A Antigen, POC: NEGATIVE
Influenza B Antigen, POC: NEGATIVE

## 2024-03-21 MED ORDER — ONDANSETRON 4 MG PO TBDP: 4.0000 mg | ORAL_TABLET | Freq: Three times a day (TID) | ORAL | 0 refills | Status: DC | PRN

## 2024-03-21 MED ORDER — PSEUDOEPH-BROMPHEN-DM 30-2-10 MG/5ML PO SYRP: 5.0000 mL | ORAL_SOLUTION | Freq: Four times a day (QID) | ORAL | 0 refills | Status: DC | PRN

## 2024-03-21 NOTE — ED Provider Notes (Signed)
 [facility]    CSN: [id number] Arrival date & time: 03/21/24  0831      History   Chief Complaint No chief complaint on file.   HPI Katie Erickson is a 18 y.o. female.   Patient presenting today with 1 day history of headache, diarrhea, body aches, cough.  Denies fever, chills, chest pain, shortness of breath, abdominal pain, vomiting, diarrhea.  So far not try anything over-the-counter for symptoms.  History of asthma and seasonal allergies on as needed regimen.    Past Medical History:  Diagnosis Date   Anxiety    Asthma    Bronchitis    Obesity     Patient Active Problem List   Diagnosis Date Noted   Trichomonas infection 12/12/2023   Nexplanon  in place 12/08/2023   Irregular intermenstrual bleeding 12/08/2023   Nexplanon  insertion 09/28/2023   Grieving 09/15/2023   Vitamin B12 deficiency 04/08/2023   Vitamin D  deficiency 04/08/2023   Contact dermatitis and eczema 02/02/2023   Encounter for initial prescription of contraceptive pills December 11, 2022   Pregnancy examination or test, negative result 12/11/2022   History of chest pain 12-11-2022   Family history of heart disease Dec 11, 2022   Family history of sudden cardiac death 12/11/22   Chest pain 2022-08-14   Encounter for surveillance of contraceptive pills 05/31/2022   Screening examination for STD (sexually transmitted disease) 05/31/2022   Encounter for menstrual regulation 09/02/2020   Dysmenorrhea 09/02/2020   Irregular periods 09/02/2020   BMI (body mass index), pediatric, greater than or equal to 95% for age 49/22/2021   Wears glasses 01/23/2020   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 No pertinent surgical history.  OB History     Gravida  0   Para  0   Term  0   Preterm  0   AB  0   Living  0      SAB  0   IAB  0   Ectopic  0   Multiple  0   Live Births  0            Home Medications    Prior to Admission  medications   Medication Sig Start Date End Date Taking? Authorizing Provider  brompheniramine-pseudoephedrine -DM 30-2-10 MG/5ML syrup Take 5 mLs by mouth 4 (four) times daily as needed. 03/21/24  Yes Stuart Vernell Norris, PA-C  ondansetron (ZOFRAN-ODT) 4 MG disintegrating tablet Take 1 tablet (4 mg total) by mouth every 8 (eight) hours as needed for nausea or vomiting. 03/21/24  Yes Stuart Vernell Norris, PA-C  APPLE CIDER VINEGAR PO Take by mouth.    [provider]  cetirizine  (ZYRTEC ) 10 MG tablet Take 1 tablet (10 mg total) by mouth daily. 02/23/22   Leath-Warren, Etta PARAS, NP  etonogestrel  (NEXPLANON ) 68 MG IMPL implant 1 each by Subdermal route once.    [provider]  fluticasone  (FLONASE ) 50 MCG/ACT nasal spray Place 2 sprays into both nostrils daily. 02/11/23   Leath-Warren, Etta PARAS, NP  ibuprofen  (ADVIL ) 800 MG tablet Take 1 tablet (800 mg total) by mouth every 6 (six) hours as needed for moderate pain. 10/07/22   Haze Lonni PARAS, MD  loperamide  (IMODIUM ) 2 MG capsule Take 1 capsule (2 mg total) by mouth 4 (four) times daily as needed for diarrhea or loose stools. 06/04/22   Leath-Warren, Etta PARAS, NP  loratadine  (CLARITIN ) 10 MG tablet TAKE 1 TABLET BY MOUTH ONCE DAILY. 08/08/20   Vicci,  Raiford DASEN, MD  montelukast  (SINGULAIR ) 4 MG chewable tablet CHEW 1 TABLET BY MOUTH AT BEDTIME. 08/08/20   Vicci Raiford DASEN, MD  triamcinolone  cream (KENALOG ) 0.1 % Apply topically 2 (two) times daily. 02/02/23   Del Wilhelmena Lloyd Sola, FNP    Family History Family History  Problem Relation Age of Onset   Hypertension Paternal Grandfather    Hypertension Paternal Grandmother    Heart disease Paternal Grandmother    Kidney disease Paternal Grandmother    Hypertension Maternal Grandmother    Diabetes Maternal Grandmother    Bronchitis Mother    Diabetes Mother    Asthma Sister    Other Sister        cardiac arrest at age 58   Cancer Other        on both sides of  family   Heart disease Other 57       had MI and died    Social History Social History   Tobacco Use   Smoking status: Never    Passive exposure: Yes   Smokeless tobacco: Never   Tobacco comments:    mom  Vaping Use   Vaping status: Never Used  Substance Use Topics   Alcohol use: Not Currently    Comment: once in a while   Drug use: Yes    Types: Marijuana    Comment: 1-2 times a week     Allergies   Cinnamon   Review of Systems Review of Systems Per HPI  Physical Exam Triage Vital Signs ED Triage Vitals  Encounter Vitals Group     BP 03/21/24 0902 108/73     Girls Systolic BP Percentile --      Girls Diastolic BP Percentile --      Boys Systolic BP Percentile --      Boys Diastolic BP Percentile --      Pulse Rate 03/21/24 0902 85     Resp 03/21/24 0902 18     Temp 03/21/24 0902 98.4 F (36.9 C)     Temp Source 03/21/24 0902 Oral     SpO2 03/21/24 0902 97 %     Weight --      Height --      Head Circumference --      Peak Flow --      Pain Score 03/21/24 0903 6     Pain Loc --      Pain Education --      Exclude from Growth Chart --    No data found.  Updated Vital Signs BP 108/73 (BP Location: Right Arm)   Pulse 85   Temp 98.4 F (36.9 C) (Oral)   Resp 18   LMP 03/19/2024 (Exact Date)   SpO2 97%   Visual Acuity Right Eye Distance:   Left Eye Distance:   Bilateral Distance:    Right Eye Near:   Left Eye Near:    Bilateral Near:     Physical Exam Vitals and nursing note reviewed.  Constitutional:      Appearance: Normal appearance.  HENT:     Head: Atraumatic.     Right Ear: Tympanic membrane and external ear normal.     Left Ear: Tympanic membrane and external ear normal.     Nose: Rhinorrhea present.     Mouth/Throat:     Mouth: Mucous membranes are moist.     Pharynx: Posterior oropharyngeal erythema present.  Eyes:     Extraocular Movements: Extraocular movements intact.  Conjunctiva/sclera: Conjunctivae normal.   Cardiovascular:     Rate and Rhythm: Normal rate and regular rhythm.     Heart sounds: Normal heart sounds.  Pulmonary:     Effort: Pulmonary effort is normal.     Breath sounds: Normal breath sounds. No wheezing or rales.  Musculoskeletal:        General: Normal range of motion.     Cervical back: Normal range of motion and neck supple.  Skin:    General: Skin is warm and dry.  Neurological:     Mental Status: She is alert and oriented to person, place, and time.  Psychiatric:        Mood and Affect: Mood normal.        Thought Content: Thought content normal.      UC Treatments / Results  Labs (all labs ordered are listed, but only abnormal results are displayed) Labs Reviewed  POC COVID19/FLU A&B COMBO    EKG   Radiology No results found.  Procedures Procedures (including critical care time)  Medications Ordered in UC Medications - No data to display  Initial Impression / Assessment and Plan / UC Course  I have reviewed the triage vital signs and the nursing notes.  Pertinent labs & imaging results that were available during my care of the patient were reviewed by me and considered in my medical decision making (see chart for details).     Rapid flu and COVID-negative, vitals and exam very reassuring today.  Suspect viral respiratory infection.  Will treat with Bromfed, Zofran for nausea, supportive over-the-counter medications and home care.  Work note given.  Return for worsening or unresolving symptoms.  Final Clinical Impressions(s) / UC Diagnoses   Final diagnoses:  Viral URI  Nausea without vomiting   Discharge Instructions   None    ED Prescriptions     Medication Sig Dispense Auth. Provider   brompheniramine-pseudoephedrine -DM 30-2-10 MG/5ML syrup Take 5 mLs by mouth 4 (four) times daily as needed. 120 mL Stuart Vernell Norris, PA-C   ondansetron (ZOFRAN-ODT) 4 MG disintegrating tablet Take 1 tablet (4 mg total) by mouth every 8 (eight)  hours as needed for nausea or vomiting. 20 tablet Stuart Vernell Norris, [place]      PDMP not reviewed this encounter.   Stuart Vernell Norris, [place] 03/21/24 1032

## 2024-03-21 NOTE — ED Triage Notes (Signed)
 Headaches, diarrhea, body aches, since yesterday.

## 2024-03-25 ENCOUNTER — Encounter: Payer: Self-pay | Admitting: Family Medicine

## 2024-04-02 ENCOUNTER — Ambulatory Visit

## 2024-04-02 VITALS — BP 111/71 | HR 93 | Ht 65.0 in | Wt 232.0 lb

## 2024-04-02 DIAGNOSIS — K5903 Drug induced constipation: Secondary | ICD-10-CM | POA: Diagnosis not present

## 2024-04-02 DIAGNOSIS — N926 Irregular menstruation, unspecified: Secondary | ICD-10-CM

## 2024-04-02 NOTE — Assessment & Plan Note (Addendum)
 Possibly related to hormonal changes or contraceptive use. Concern about potential pregnancy despite arm implant. Previous home pregnancy test negative. - urine pregnancy test in office was negative.

## 2024-04-02 NOTE — Progress Notes (Signed)
 Established Patient Office Visit  Subjective   Patient ID: Katie Erickson, female    DOB: [birth year]  Age: 18 y.o. MRN: [id number]  Chief Complaint  Patient presents with   Medical Management of Chronic Issues    GI Issues    HPI Discussed the use of AI scribe software for clinical note transcription with the patient, who gave verbal consent to proceed.  History of Present Illness    Katie Erickson is a 18 year old female who presents with abdominal pain and constipation.  Abdominal pain and constipation - Abdominal pain present, associated with constipation - Constipation began after starting Zofran  for nausea at urgent care - Last bowel movement on Saturday, March 31, 2024 - Significant straining required during bowel movements - No recent use of Zofran  - No current nausea or vomiting  Menstrual irregularity and contraception - Irregular menstrual cycles, previously regulated by birth control - Currently has a birth control implant in her arm - Last home pregnancy test in September 2025 was negative  Weight and mood concerns - Concerned about potential weight changes and mood alterations, as observed by others  Family history of constipation - Sister has a history of constipation and uses Miralax regularly     Patient Active Problem List   Diagnosis Date Noted   Trichomonas infection 12/12/2023   Nexplanon  in place 12/08/2023   Irregular intermenstrual bleeding 12/08/2023   Nexplanon  insertion 09/28/2023   Grieving 09/15/2023   Vitamin B12 deficiency 04/08/2023   Vitamin D  deficiency 04/08/2023   Contact dermatitis and eczema 02/02/2023   Encounter for initial prescription of contraceptive pills 01/08/23   History of chest pain Jan 08, 2023   Family history of heart disease 01-08-23   Family history of sudden cardiac death Jan 08, 2023   Chest pain 2022/09/11   Encounter for surveillance of contraceptive pills 05/31/2022   Screening examination for STD  (sexually transmitted disease) 05/31/2022   Encounter for menstrual regulation 09/02/2020   Dysmenorrhea 09/02/2020   Irregular periods 09/02/2020   BMI (body mass index), pediatric, greater than or equal to 95% for age 43/22/2021   Wears glasses 01/23/2020   Intrinsic eczema 04/05/2017   Seasonal allergies 08/29/2012    ROS    Objective:     BP 111/71   Pulse 93   Ht 5' 5 (1.651 m)   Wt 232 lb (105.2 kg)   LMP 03/19/2024 (Exact Date)   SpO2 97%   BMI 38.61 kg/m  BP Readings from Last 3 Encounters:  04/02/24 111/71  03/21/24 108/73  03/19/24 109/76   Wt Readings from Last 3 Encounters:  04/02/24 232 lb (105.2 kg)  03/19/24 232 lb (105.2 kg)  12/08/23 224 lb (101.6 kg) (99%, Z= 2.26)*   * Growth percentiles are based on CDC (Girls, 2-20 Years) data.      Physical Exam Vitals and nursing note reviewed.  Constitutional:      Appearance: Normal appearance.  HENT:     Head: Normocephalic.     Right Ear: Tympanic membrane, ear canal and external ear normal.     Left Ear: Tympanic membrane, ear canal and external ear normal.     Nose: Nose normal.     Mouth/Throat:     Mouth: Mucous membranes are moist.     Pharynx: Oropharynx is clear.  Eyes:     Extraocular Movements: Extraocular movements intact.     Pupils: Pupils are equal, round, and reactive to light.  Cardiovascular:     Rate and  Rhythm: Normal rate and regular rhythm.  Pulmonary:     Effort: Pulmonary effort is normal.     Breath sounds: Normal breath sounds.  Abdominal:     General: Bowel sounds are normal.     Palpations: Abdomen is soft.     Tenderness: There is no right CVA tenderness, left CVA tenderness or rebound.  Musculoskeletal:     Cervical back: Normal range of motion and neck supple.  Skin:    General: Skin is warm and dry.  Neurological:     Mental Status: She is alert and oriented to person, place, and time.  Psychiatric:        Mood and Affect: Mood normal.        Thought  Content: Thought content normal.      No results found for any visits on 04/02/24.    The ASCVD Risk score (Arnett DK, et al., 2019) failed to calculate for the following reasons:   The 2019 ASCVD risk score is only valid for ages 39 to 56    Assessment & Plan:   Problem List Items Addressed This Visit       Other   Irregular periods - Primary   Possibly related to hormonal changes or contraceptive use. Concern about potential pregnancy despite arm implant. Previous home pregnancy test negative. - urine pregnancy test in office was negative.      Relevant Orders   Pregnancy, urine   Other Visit Diagnoses       Drug-induced constipation       Likely secondary to Zofran  use. - Provided Miralax samples.       No follow-ups on file.    Leita Longs, FNP

## 2024-04-04 LAB — PREGNANCY, URINE: Preg Test, Ur: NEGATIVE

## 2024-04-05 ENCOUNTER — Other Ambulatory Visit (HOSPITAL_COMMUNITY)
Admission: RE | Admit: 2024-04-05 | Discharge: 2024-04-05 | Disposition: A | Source: Ambulatory Visit | Attending: Adult Health | Admitting: Adult Health

## 2024-04-05 ENCOUNTER — Encounter: Payer: Self-pay | Admitting: Adult Health

## 2024-04-05 ENCOUNTER — Ambulatory Visit: Admitting: Adult Health

## 2024-04-05 VITALS — BP 113/77 | HR 83 | Ht 65.0 in | Wt 236.0 lb

## 2024-04-05 DIAGNOSIS — N898 Other specified noninflammatory disorders of vagina: Secondary | ICD-10-CM | POA: Diagnosis not present

## 2024-04-05 DIAGNOSIS — Z113 Encounter for screening for infections with a predominantly sexual mode of transmission: Secondary | ICD-10-CM

## 2024-04-05 DIAGNOSIS — R102 Pelvic and perineal pain unspecified side: Secondary | ICD-10-CM | POA: Diagnosis not present

## 2024-04-05 DIAGNOSIS — L2989 Other pruritus: Secondary | ICD-10-CM | POA: Diagnosis not present

## 2024-04-05 DIAGNOSIS — N921 Excessive and frequent menstruation with irregular cycle: Secondary | ICD-10-CM | POA: Diagnosis not present

## 2024-04-05 DIAGNOSIS — Z975 Presence of (intrauterine) contraceptive device: Secondary | ICD-10-CM

## 2024-04-05 LAB — CERVICOVAGINAL ANCILLARY ONLY
Bacterial Vaginitis (gardnerella): POSITIVE — AB
Candida Glabrata: NEGATIVE
Candida Vaginitis: NEGATIVE
Chlamydia: NEGATIVE
Comment: NEGATIVE
Comment: NEGATIVE
Comment: NEGATIVE
Comment: NEGATIVE
Comment: NEGATIVE
Comment: NORMAL
Neisseria Gonorrhea: NEGATIVE
Trichomonas: NEGATIVE

## 2024-04-05 NOTE — Progress Notes (Signed)
  Subjective:     Patient ID: Sharlot JONETTA Blush, female   DOB: [birth year], 18 y.o.   MRN: [id number]  HPI Hamda is a 22 old black female, with SO, G0P0, in complaining of vaginal itching, vaginal pain, and lite spotting at times with nexplanon .  PCP is I Polanco NP  Review of Systems +vaginal itching +vaginal pain, has new nails, wonders if scratched Lite spotting with nexplanon  at times Reviewed past medical,surgical, social and family history. Reviewed medications and allergies.     Objective:   Physical Exam BP 113/77 (BP Location: Right Arm, Patient Position: Sitting, Cuff Size: Large)   Pulse 83   Ht 5' 5 (1.651 m)   Wt 236 lb (107 kg)   LMP 03/19/2024 (Exact Date)   BMI 39.27 kg/m     Skin warm and dry.Pelvic: external genitalia is normal in appearance no lesions, vagina: white discharge with odor,no lesions seen, urethra has no lesions or masses noted, cervix:smooth, uterus: normal size, shape and contour, non tender, no masses felt, adnexa: no masses or tenderness noted. Bladder is non tender and no masses felt. CV swab obtained. Fall risk is low  Upstream - 04/05/24 1045       Pregnancy Intention Screening   Does the patient want to become pregnant in the next year? Yes    Does the patient's partner want to become pregnant in the next year? Yes    Would the patient like to discuss contraceptive options today? No      Contraception Wrap Up   Current Method Hormonal Implant    End Method Hormonal Implant    Contraception Counseling Provided Yes         Examination chaperoned by Clarita Salt LPN  Assessment:     1. Vaginal itching (Primary) CV swabs sent  - Cervicovaginal ancillary only( [place])  2. Vaginal discharge +white discharge with odor CV swab sent  - Cervicovaginal ancillary only( [place])  3. Vaginal pain No lesions seen CV swab sent  - Cervicovaginal ancillary only( [place])  4. Nexplanon  in place Placed 09/28/23  5. Irregular  intermenstrual bleeding Lite spotting at times None today, will watch for now   6. Screening examination for STD (sexually transmitted disease) CV swab sent for GC/CHL,trich, BV and yeast  - Cervicovaginal ancillary only( [place])     Plan:     Follow up prn

## 2024-04-06 ENCOUNTER — Ambulatory Visit: Payer: Self-pay | Admitting: Adult Health

## 2024-04-06 MED ORDER — METRONIDAZOLE 500 MG PO TABS: 500.0000 mg | ORAL_TABLET | Freq: Two times a day (BID) | ORAL | 0 refills | Status: AC

## 2024-05-10 ENCOUNTER — Ambulatory Visit
Admission: EM | Admit: 2024-05-10 | Discharge: 2024-05-10 | Disposition: A | Discharge status: Home / Self Care | Attending: Nurse Practitioner | Admitting: Nurse Practitioner

## 2024-05-10 DIAGNOSIS — J101 Influenza due to other identified influenza virus with other respiratory manifestations: Secondary | ICD-10-CM | POA: Diagnosis not present

## 2024-05-10 LAB — POCT INFLUENZA A/B
Influenza A, POC: POSITIVE — AB
Influenza B, POC: NEGATIVE

## 2024-05-10 MED ORDER — PROMETHAZINE-DM 6.25-15 MG/5ML PO SYRP: 5.0000 mL | ORAL_SOLUTION | Freq: Four times a day (QID) | ORAL | 0 refills | Status: AC | PRN

## 2024-05-10 MED ORDER — OSELTAMIVIR PHOSPHATE 75 MG PO CAPS: 75.0000 mg | ORAL_CAPSULE | Freq: Two times a day (BID) | ORAL | 0 refills | Status: AC

## 2024-05-10 MED ORDER — AZELASTINE HCL 0.1 % NA SOLN: 2.0000 | Freq: Two times a day (BID) | NASAL | 0 refills | Status: AC

## 2024-05-10 NOTE — ED Triage Notes (Signed)
 Pt reports cough, congestion, body aches fever x 1 day loss of taste and smell today

## 2024-05-10 NOTE — ED Provider Notes (Signed)
 " [facility]    CSN: [id number] Arrival date & time: 05/10/24  1557      History   Chief Complaint No chief complaint on file.   HPI Katie Erickson is a 19 y.o. female.   The history is provided by the patient.   Patient presents with a 1 day history of bodyaches, body aches, cough, nasal congestion, subjective fever, and loss of taste and smell.  Patient denies ear pain, ear drainage, abdominal pain, nausea, vomiting, diarrhea, or rash.  Patient states several members in her household have been sick.  States that she has been increasing fluids, using normal saline nasal spray, and staying hydrated.  Past Medical History:  Diagnosis Date   Anxiety    Asthma    Bronchitis    Obesity     Patient Active Problem List   Diagnosis Date Noted   Vaginal pain 04/05/2024   Vaginal discharge 04/05/2024   Vaginal itching 04/05/2024   Trichomonas infection 12/12/2023   Nexplanon  in place 12/08/2023   Irregular intermenstrual bleeding 12/08/2023   Nexplanon  insertion 09/28/2023   Grieving 09/15/2023   Vitamin B12 deficiency 04/08/2023   Vitamin D  deficiency 04/08/2023   Contact dermatitis and eczema 02/02/2023   Encounter for initial prescription of contraceptive pills 2022-12-30   History of chest pain December 30, 2022   Family history of heart disease 12-30-2022   Family history of sudden cardiac death 2022/12/30   Chest pain 09-02-2022   Encounter for surveillance of contraceptive pills 05/31/2022   Screening examination for STD (sexually transmitted disease) 05/31/2022   Encounter for menstrual regulation 09/02/2020   Dysmenorrhea 09/02/2020   Irregular periods 09/02/2020   BMI (body mass index), pediatric, greater than or equal to 95% for age 13/22/2021   Wears glasses 01/23/2020   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 No pertinent surgical history.  OB History     Gravida  0   Para  0   Term  0   Preterm  0    AB  0   Living  0      SAB  0   IAB  0   Ectopic  0   Multiple  0   Live Births  0            Home Medications    Prior to Admission medications  Medication Sig Start Date End Date Taking? Authorizing Provider  azelastine  (ASTELIN ) 0.1 % nasal spray Place 2 sprays into both nostrils 2 (two) times daily. Use in each nostril as directed 05/10/24  Yes Leath-Warren, Etta PARAS, NP  oseltamivir  (TAMIFLU ) 75 MG capsule Take 1 capsule (75 mg total) by mouth every 12 (twelve) hours. 05/10/24  Yes Leath-Warren, Etta PARAS, NP  promethazine -dextromethorphan  (PROMETHAZINE -DM) 6.25-15 MG/5ML syrup Take 5 mLs by mouth 4 (four) times daily as needed. 05/10/24  Yes Leath-Warren, Etta PARAS, NP  cetirizine  (ZYRTEC ) 10 MG tablet Take 1 tablet (10 mg total) by mouth daily. 02/23/22   Leath-Warren, Etta PARAS, NP  etonogestrel  (NEXPLANON ) 68 MG IMPL implant 1 each by Subdermal route once.    [provider]  fluticasone  (FLONASE ) 50 MCG/ACT nasal spray Place 2 sprays into both nostrils daily. 02/11/23   Leath-Warren, Etta PARAS, NP  ibuprofen  (ADVIL ) 800 MG tablet Take 1 tablet (800 mg total) by mouth every 6 (six) hours as needed for moderate pain. 10/07/22   Haze Lonni PARAS, MD  loratadine  (CLARITIN ) 10 MG tablet TAKE 1 TABLET  BY MOUTH ONCE DAILY. 08/08/20   Vicci Raiford DASEN, MD  metroNIDAZOLE  (FLAGYL ) 500 MG tablet Take 1 tablet (500 mg total) by mouth 2 (two) times daily. 04/06/24   Signa Delon LABOR, NP  montelukast  (SINGULAIR ) 4 MG chewable tablet CHEW 1 TABLET BY MOUTH AT BEDTIME. 08/08/20   Vicci Raiford DASEN, MD  triamcinolone  cream (KENALOG ) 0.1 % Apply topically 2 (two) times daily. 02/02/23   Del Wilhelmena Lloyd Sola, FNP    Family History Family History  Problem Relation Age of Onset   Hypertension Paternal Grandfather    Hypertension Paternal Grandmother    Heart disease Paternal Grandmother    Kidney disease Paternal Grandmother    Hypertension Maternal Grandmother     Diabetes Maternal Grandmother    Bronchitis Mother    Diabetes Mother    Asthma Sister    Other Sister        cardiac arrest at age 73   Cancer Other        on both sides of family   Heart disease Other 54       had MI and died    Social History Social History[1]   Allergies   Cinnamon   Review of Systems Review of Systems Per HPI  Physical Exam Triage Vital Signs ED Triage Vitals  Encounter Vitals Group     BP 05/10/24 1605 122/82     Girls Systolic BP Percentile --      Girls Diastolic BP Percentile --      Boys Systolic BP Percentile --      Boys Diastolic BP Percentile --      Pulse Rate 05/10/24 1605 (!) 112     Resp 05/10/24 1605 20     Temp 05/10/24 1605 100 F (37.8 C)     Temp Source 05/10/24 1605 Oral     SpO2 05/10/24 1605 97 %     Weight --      Height --      Head Circumference --      Peak Flow --      Pain Score 05/10/24 1606 4     Pain Loc --      Pain Education --      Exclude from Growth Chart --    No data found.  Updated Vital Signs BP 122/82 (BP Location: Right Arm)   Pulse (!) 112   Temp 100 F (37.8 C) (Oral)   Resp 20   LMP  (LMP Unknown)   SpO2 97%   Visual Acuity Right Eye Distance:   Left Eye Distance:   Bilateral Distance:    Right Eye Near:   Left Eye Near:    Bilateral Near:     Physical Exam Vitals and nursing note reviewed.  Constitutional:      General: She is not in acute distress.    Appearance: Normal appearance.  HENT:     Head: Normocephalic.     Right Ear: Tympanic membrane, ear canal and external ear normal.     Left Ear: Tympanic membrane, ear canal and external ear normal.     Nose: Congestion and rhinorrhea present.     Right Turbinates: Enlarged and swollen.     Left Turbinates: Enlarged and swollen.     Right Sinus: No maxillary sinus tenderness or frontal sinus tenderness.     Left Sinus: No maxillary sinus tenderness or frontal sinus tenderness.     Mouth/Throat:     Lips: Pink.  Mouth: Mucous membranes are moist.     Pharynx: Postnasal drip present. No pharyngeal swelling, oropharyngeal exudate, posterior oropharyngeal erythema or uvula swelling.     Comments: Cobblestoning present to posterior oropharynx  Eyes:     Extraocular Movements: Extraocular movements intact.     Conjunctiva/sclera: Conjunctivae normal.     Pupils: Pupils are equal, round, and reactive to light.  Cardiovascular:     Rate and Rhythm: Normal rate and regular rhythm.     Pulses: Normal pulses.     Heart sounds: Normal heart sounds.  Pulmonary:     Effort: Pulmonary effort is normal. No respiratory distress.     Breath sounds: Normal breath sounds. No stridor. No wheezing, rhonchi or rales.  Abdominal:     General: Bowel sounds are normal.     Palpations: Abdomen is soft.  Musculoskeletal:     Cervical back: Normal range of motion.  Skin:    General: Skin is warm and dry.  Neurological:     General: No focal deficit present.     Mental Status: She is alert and oriented to person, place, and time.  Psychiatric:        Mood and Affect: Mood normal.        Behavior: Behavior normal.      UC Treatments / Results  Labs (all labs ordered are listed, but only abnormal results are displayed) Labs Reviewed  POCT INFLUENZA A/B - Abnormal; Notable for the following components:      Result Value   Influenza A, POC Positive (*)    All other components within normal limits    EKG   Radiology No results found.  Procedures Procedures (including critical care time)  Medications Ordered in UC Medications - No data to display  Initial Impression / Assessment and Plan / UC Course  I have reviewed the triage vital signs and the nursing notes.  Pertinent labs & imaging results that were available during my care of the patient were reviewed by me and considered in my medical decision making (see chart for details).  Influenza test was positive for influenza A.  Patient has elected to  begin Tamiflu .  Treat with Tamiflu  75 mg.  Symptomatic treatment provided with Promethazine  DM for the cough and azelastine  nasal spray.  Supportive care recommendations were provided and discussed with the patient to include fluids, rest, normal saline nasal spray, and use of a humidifier during sleep.  Discussed indications with the patient regarding follow-up.  Patient was in agreement with this plan of care and verbalizes understanding.  All questions were answered.  Patient stable for discharge.  Work note was provided.  Final Clinical Impressions(s) / UC Diagnoses   Final diagnoses:  Influenza A     Discharge Instructions      You have tested positive for flu A. Take medication as prescribed. Increase fluids and allow for plenty of rest. You may take over-the-counter Tylenol  or Ibuprofen  as needed for pain, fever, or general discomfort. Recommend the use of normal saline nasal spray throughout the day for nasal congestion or runny nose. For your cough, you may find it helpful to use a humidifier in your bedroom at nighttime during sleep and to sleep elevated on pillows while symptoms persist. You do need to remain home until you have been fever free for 24 hours with no medication. Symptoms should begin to improve over the next 5 to 7 days.  If symptoms fail to improve, or begin to worsen, you may  follow-up in this clinic or with your primary care physician for further evaluation. Follow-up as needed.     ED Prescriptions     Medication Sig Dispense Auth. Provider   oseltamivir  (TAMIFLU ) 75 MG capsule Take 1 capsule (75 mg total) by mouth every 12 (twelve) hours. 10 capsule Leath-Warren, Malcomb Gangemi J, NP   azelastine  (ASTELIN ) 0.1 % nasal spray Place 2 sprays into both nostrils 2 (two) times daily. Use in each nostril as directed 30 mL Leath-Warren, Etta PARAS, NP   promethazine -dextromethorphan  (PROMETHAZINE -DM) 6.25-15 MG/5ML syrup Take 5 mLs by mouth 4 (four) times daily as  needed. 118 mL Leath-Warren, Etta PARAS, NP      PDMP not reviewed this encounter.     [1]  Social History Tobacco Use   Smoking status: Never    Passive exposure: Yes   Smokeless tobacco: Never   Tobacco comments:    mom  Vaping Use   Vaping status: Never Used  Substance Use Topics   Alcohol use: Not Currently    Comment: once in a while   Drug use: Yes    Types: Marijuana    Comment: 1-2 times a week     Gilmer Etta PARAS, NP 05/10/24 1623  "

## 2024-05-10 NOTE — Discharge Instructions (Signed)
 You have tested positive for flu A. Take medication as prescribed. Increase fluids and allow for plenty of rest. You may take over-the-counter Tylenol  or Ibuprofen  as needed for pain, fever, or general discomfort. Recommend the use of normal saline nasal spray throughout the day for nasal congestion or runny nose. For your cough, you may find it helpful to use a humidifier in your bedroom at nighttime during sleep and to sleep elevated on pillows while symptoms persist. You do need to remain home until you have been fever free for 24 hours with no medication. Symptoms should begin to improve over the next 5 to 7 days.  If symptoms fail to improve, or begin to worsen, you may follow-up in this clinic or with your primary care physician for further evaluation. Follow-up as needed.

## 2024-09-17 ENCOUNTER — Ambulatory Visit: Admitting: Family Medicine
